# Patient Record
Sex: Female | Born: 1960 | Race: Black or African American | Hispanic: No | Marital: Married | Smoking: Never smoker
Health system: Southern US, Community
[De-identification: ages and names within clinical notes are randomized; demographics above are authoritative.]

## PROBLEM LIST (undated history)

## (undated) DIAGNOSIS — D649 Anemia, unspecified: Secondary | ICD-10-CM

## (undated) DIAGNOSIS — E785 Hyperlipidemia, unspecified: Secondary | ICD-10-CM

## (undated) DIAGNOSIS — J45909 Unspecified asthma, uncomplicated: Secondary | ICD-10-CM

## (undated) DIAGNOSIS — E119 Type 2 diabetes mellitus without complications: Secondary | ICD-10-CM

## (undated) DIAGNOSIS — I10 Essential (primary) hypertension: Secondary | ICD-10-CM

## (undated) HISTORY — DX: Hyperlipidemia, unspecified: E78.5

## (undated) HISTORY — DX: Anemia, unspecified: D64.9

## (undated) HISTORY — DX: Type 2 diabetes mellitus without complications: E11.9

## (undated) HISTORY — DX: Unspecified asthma, uncomplicated: J45.909

## (undated) HISTORY — PX: APPENDECTOMY: SHX54

## (undated) HISTORY — DX: Essential (primary) hypertension: I10

---

## 2005-06-21 ENCOUNTER — Ambulatory Visit: Payer: Self-pay | Admitting: Cardiology

## 2006-04-13 ENCOUNTER — Encounter (INDEPENDENT_AMBULATORY_CARE_PROVIDER_SITE_OTHER): Payer: Self-pay | Admitting: Internal Medicine

## 2006-09-19 ENCOUNTER — Ambulatory Visit: Payer: Self-pay | Admitting: Family Medicine

## 2006-09-19 DIAGNOSIS — R5381 Other malaise: Secondary | ICD-10-CM | POA: Insufficient documentation

## 2006-09-19 DIAGNOSIS — K219 Gastro-esophageal reflux disease without esophagitis: Secondary | ICD-10-CM | POA: Insufficient documentation

## 2006-09-19 DIAGNOSIS — R5383 Other fatigue: Secondary | ICD-10-CM

## 2006-09-27 ENCOUNTER — Encounter (INDEPENDENT_AMBULATORY_CARE_PROVIDER_SITE_OTHER): Payer: Self-pay | Admitting: Family Medicine

## 2006-10-02 LAB — CONVERTED CEMR LAB
ALT: 26 units/L (ref 0–35)
AST: 25 units/L (ref 0–37)
Albumin: 3.8 g/dL (ref 3.5–5.2)
Alkaline Phosphatase: 57 units/L (ref 39–117)
BUN: 16 mg/dL (ref 6–23)
Basophils Absolute: 0 10*3/uL (ref 0.0–0.1)
Basophils Relative: 0 % (ref 0–1)
CO2: 25 meq/L (ref 19–32)
Calcium: 8.7 mg/dL (ref 8.4–10.5)
Chloride: 104 meq/L (ref 96–112)
Cholesterol: 175 mg/dL (ref 0–200)
Creatinine, Ser: 0.85 mg/dL (ref 0.40–1.20)
Eosinophils Absolute: 0.3 10*3/uL (ref 0.0–0.7)
Eosinophils Relative: 3 % (ref 0–5)
Glucose, Bld: 96 mg/dL (ref 70–99)
HCT: 33.8 % — ABNORMAL LOW (ref 36.0–46.0)
HDL: 46 mg/dL (ref >39)
Hemoglobin: 10.7 g/dL — ABNORMAL LOW (ref 12.0–15.0)
LDL Cholesterol: 112 mg/dL — ABNORMAL HIGH (ref 0–99)
Lymphocytes Relative: 33 % (ref 12–46)
Lymphs Abs: 2.8 10*3/uL (ref 0.7–3.3)
MCHC: 31.7 g/dL (ref 30.0–36.0)
MCV: 80.3 fL (ref 78.0–100.0)
Monocytes Absolute: 0.5 10*3/uL (ref 0.2–0.7)
Monocytes Relative: 6 % (ref 3–11)
Neutro Abs: 4.8 10*3/uL (ref 1.7–7.7)
Neutrophils Relative %: 58 % (ref 43–77)
Platelets: 327 10*3/uL (ref 150–400)
Potassium: 4.4 meq/L (ref 3.5–5.3)
RBC: 4.21 M/uL (ref 3.87–5.11)
RDW: 17.2 % — ABNORMAL HIGH (ref 11.5–14.0)
Sodium: 140 meq/L (ref 135–145)
TSH: 1.743 microintl units/mL (ref 0.350–5.50)
Total Bilirubin: 0.3 mg/dL (ref 0.3–1.2)
Total CHOL/HDL Ratio: 3.8
Total Protein: 7 g/dL (ref 6.0–8.3)
Triglycerides: 87 mg/dL (ref <150)
VLDL: 17 mg/dL (ref 0–40)
WBC: 8.4 10*3/uL (ref 4.0–10.5)

## 2006-10-03 ENCOUNTER — Encounter (INDEPENDENT_AMBULATORY_CARE_PROVIDER_SITE_OTHER): Payer: Self-pay | Admitting: Family Medicine

## 2006-10-17 ENCOUNTER — Ambulatory Visit: Payer: Self-pay | Admitting: Family Medicine

## 2006-10-17 DIAGNOSIS — E785 Hyperlipidemia, unspecified: Secondary | ICD-10-CM | POA: Insufficient documentation

## 2006-10-17 DIAGNOSIS — D509 Iron deficiency anemia, unspecified: Secondary | ICD-10-CM | POA: Insufficient documentation

## 2006-10-17 LAB — CONVERTED CEMR LAB
Cholesterol, target level: 200 mg/dL
HDL goal, serum: 40 mg/dL
LDL Goal: 160 mg/dL

## 2006-10-18 ENCOUNTER — Encounter (INDEPENDENT_AMBULATORY_CARE_PROVIDER_SITE_OTHER): Payer: Self-pay | Admitting: Family Medicine

## 2006-10-18 LAB — CONVERTED CEMR LAB
Candida species: NEGATIVE
Gardnerella vaginalis: NEGATIVE
Pap Smear: NORMAL
Trichomonal Vaginitis: POSITIVE — AB

## 2006-10-19 LAB — CONVERTED CEMR LAB
Ferritin: 10 ng/mL (ref 10–291)
Folate: 16.2 ng/mL
Iron: 24 ug/dL — ABNORMAL LOW (ref 42–145)
Retic Count, Absolute: 84.6 (ref 19.0–186.0)
Retic Ct Pct: 2 % (ref 0.4–3.1)
Saturation Ratios: 7 % — ABNORMAL LOW (ref 20–55)
TIBC: 358 ug/dL (ref 250–470)
UIBC: 334 ug/dL
Vitamin B-12: 496 pg/mL (ref 211–911)

## 2006-11-06 ENCOUNTER — Ambulatory Visit (HOSPITAL_COMMUNITY): Admission: RE | Admit: 2006-11-06 | Discharge: 2006-11-06 | Payer: Self-pay | Admitting: Family Medicine

## 2006-11-30 ENCOUNTER — Ambulatory Visit: Payer: Self-pay | Admitting: Family Medicine

## 2006-12-01 ENCOUNTER — Encounter (INDEPENDENT_AMBULATORY_CARE_PROVIDER_SITE_OTHER): Payer: Self-pay | Admitting: Family Medicine

## 2006-12-01 DIAGNOSIS — N920 Excessive and frequent menstruation with regular cycle: Secondary | ICD-10-CM | POA: Insufficient documentation

## 2006-12-05 ENCOUNTER — Telehealth (INDEPENDENT_AMBULATORY_CARE_PROVIDER_SITE_OTHER): Payer: Self-pay | Admitting: *Deleted

## 2007-03-01 ENCOUNTER — Ambulatory Visit: Payer: Self-pay | Admitting: Family Medicine

## 2007-03-02 LAB — CONVERTED CEMR LAB
Basophils Absolute: 0 10*3/uL (ref 0.0–0.1)
Basophils Relative: 0 % (ref 0–1)
Eosinophils Absolute: 0.4 10*3/uL (ref 0.0–0.7)
Eosinophils Relative: 4 % (ref 0–5)
HCT: 36.4 % (ref 36.0–46.0)
Hemoglobin: 11.3 g/dL — ABNORMAL LOW (ref 12.0–15.0)
Lymphocytes Relative: 34 % (ref 12–46)
Lymphs Abs: 3.6 10*3/uL — ABNORMAL HIGH (ref 0.7–3.3)
MCHC: 31 g/dL (ref 30.0–36.0)
MCV: 79.3 fL (ref 78.0–100.0)
Monocytes Absolute: 0.5 10*3/uL (ref 0.2–0.7)
Monocytes Relative: 5 % (ref 3–11)
Neutro Abs: 6.2 10*3/uL (ref 1.7–7.7)
Neutrophils Relative %: 58 % (ref 43–77)
Platelets: 333 10*3/uL (ref 150–400)
RBC: 4.59 M/uL (ref 3.87–5.11)
RDW: 17.4 % — ABNORMAL HIGH (ref 11.5–14.0)
WBC: 10.8 10*3/uL — ABNORMAL HIGH (ref 4.0–10.5)

## 2007-03-14 ENCOUNTER — Ambulatory Visit (HOSPITAL_COMMUNITY): Admission: RE | Admit: 2007-03-14 | Discharge: 2007-03-14 | Payer: Self-pay | Admitting: Family Medicine

## 2007-03-15 ENCOUNTER — Telehealth (INDEPENDENT_AMBULATORY_CARE_PROVIDER_SITE_OTHER): Payer: Self-pay | Admitting: *Deleted

## 2007-04-20 ENCOUNTER — Ambulatory Visit: Payer: Self-pay | Admitting: Family Medicine

## 2007-04-27 ENCOUNTER — Ambulatory Visit: Payer: Self-pay | Admitting: Family Medicine

## 2007-04-28 ENCOUNTER — Encounter (INDEPENDENT_AMBULATORY_CARE_PROVIDER_SITE_OTHER): Payer: Self-pay | Admitting: Family Medicine

## 2007-05-11 ENCOUNTER — Ambulatory Visit: Payer: Self-pay | Admitting: Family Medicine

## 2007-06-08 ENCOUNTER — Ambulatory Visit: Payer: Self-pay | Admitting: Family Medicine

## 2007-06-08 LAB — CONVERTED CEMR LAB: Hemoglobin: 10 g/dL

## 2007-06-26 ENCOUNTER — Ambulatory Visit: Payer: Self-pay | Admitting: Family Medicine

## 2007-06-26 DIAGNOSIS — I10 Essential (primary) hypertension: Secondary | ICD-10-CM | POA: Insufficient documentation

## 2007-06-26 LAB — CONVERTED CEMR LAB
Glucose, Urine, Semiquant: NEGATIVE
Hemoglobin: 13.5 g/dL
Nitrite: NEGATIVE
Protein, U semiquant: 100
Specific Gravity, Urine: 1.025
Urobilinogen, UA: 0.2
pH: 6

## 2007-06-27 ENCOUNTER — Encounter (INDEPENDENT_AMBULATORY_CARE_PROVIDER_SITE_OTHER): Payer: Self-pay | Admitting: Family Medicine

## 2007-06-29 ENCOUNTER — Telehealth (INDEPENDENT_AMBULATORY_CARE_PROVIDER_SITE_OTHER): Payer: Self-pay | Admitting: Family Medicine

## 2007-06-29 LAB — CONVERTED CEMR LAB
BUN: 23 mg/dL (ref 6–23)
Basophils Absolute: 0 10*3/uL (ref 0.0–0.1)
Basophils Relative: 0 % (ref 0–1)
CO2: 22 meq/L (ref 19–32)
Calcium: 9 mg/dL (ref 8.4–10.5)
Chloride: 98 meq/L (ref 96–112)
Creatinine, Ser: 0.94 mg/dL (ref 0.40–1.20)
Eosinophils Absolute: 0.3 10*3/uL (ref 0.0–0.7)
Eosinophils Relative: 2 % (ref 0–5)
Glucose, Bld: 97 mg/dL (ref 70–99)
HCT: 38.8 % (ref 36.0–46.0)
Hemoglobin: 12.3 g/dL (ref 12.0–15.0)
Lymphocytes Relative: 31 % (ref 12–46)
Lymphs Abs: 3.4 10*3/uL (ref 0.7–4.0)
MCHC: 31.7 g/dL (ref 30.0–36.0)
MCV: 77.6 fL — ABNORMAL LOW (ref 78.0–100.0)
Monocytes Absolute: 0.7 10*3/uL (ref 0.1–1.0)
Monocytes Relative: 6 % (ref 3–12)
Neutro Abs: 6.7 10*3/uL (ref 1.7–7.7)
Neutrophils Relative %: 61 % (ref 43–77)
Platelets: 381 10*3/uL (ref 150–400)
Potassium: 4.7 meq/L (ref 3.5–5.3)
RBC: 5 M/uL (ref 3.87–5.11)
RDW: 17 % — ABNORMAL HIGH (ref 11.5–15.5)
Sodium: 137 meq/L (ref 135–145)
WBC: 11 10*3/uL — ABNORMAL HIGH (ref 4.0–10.5)

## 2007-08-03 ENCOUNTER — Telehealth (INDEPENDENT_AMBULATORY_CARE_PROVIDER_SITE_OTHER): Payer: Self-pay | Admitting: *Deleted

## 2007-08-03 ENCOUNTER — Ambulatory Visit: Payer: Self-pay | Admitting: Family Medicine

## 2007-08-03 DIAGNOSIS — M25519 Pain in unspecified shoulder: Secondary | ICD-10-CM | POA: Insufficient documentation

## 2007-08-08 ENCOUNTER — Encounter (HOSPITAL_COMMUNITY): Admission: RE | Admit: 2007-08-08 | Discharge: 2007-09-07 | Payer: Self-pay | Admitting: Family Medicine

## 2007-08-08 ENCOUNTER — Encounter (INDEPENDENT_AMBULATORY_CARE_PROVIDER_SITE_OTHER): Payer: Self-pay | Admitting: Family Medicine

## 2007-09-04 ENCOUNTER — Encounter (INDEPENDENT_AMBULATORY_CARE_PROVIDER_SITE_OTHER): Payer: Self-pay | Admitting: Family Medicine

## 2007-09-10 ENCOUNTER — Encounter (HOSPITAL_COMMUNITY): Admission: RE | Admit: 2007-09-10 | Discharge: 2007-10-10 | Payer: Self-pay | Admitting: Family Medicine

## 2007-09-13 ENCOUNTER — Encounter (INDEPENDENT_AMBULATORY_CARE_PROVIDER_SITE_OTHER): Payer: Self-pay | Admitting: Family Medicine

## 2007-09-14 ENCOUNTER — Ambulatory Visit: Payer: Self-pay | Admitting: Family Medicine

## 2007-10-03 ENCOUNTER — Encounter (INDEPENDENT_AMBULATORY_CARE_PROVIDER_SITE_OTHER): Payer: Self-pay | Admitting: Family Medicine

## 2007-10-27 ENCOUNTER — Encounter (INDEPENDENT_AMBULATORY_CARE_PROVIDER_SITE_OTHER): Payer: Self-pay | Admitting: Family Medicine

## 2007-10-29 ENCOUNTER — Telehealth (INDEPENDENT_AMBULATORY_CARE_PROVIDER_SITE_OTHER): Payer: Self-pay | Admitting: *Deleted

## 2007-10-29 LAB — CONVERTED CEMR LAB
ALT: 25 units/L (ref 0–35)
AST: 29 units/L (ref 0–37)
Albumin: 3.8 g/dL (ref 3.5–5.2)
Alkaline Phosphatase: 63 units/L (ref 39–117)
BUN: 14 mg/dL (ref 6–23)
Basophils Absolute: 0 10*3/uL (ref 0.0–0.1)
Basophils Relative: 0 % (ref 0–1)
CO2: 25 meq/L (ref 19–32)
Calcium: 8.4 mg/dL (ref 8.4–10.5)
Chloride: 105 meq/L (ref 96–112)
Cholesterol: 162 mg/dL (ref 0–200)
Creatinine, Ser: 0.84 mg/dL (ref 0.40–1.20)
Eosinophils Absolute: 0.4 10*3/uL (ref 0.0–0.7)
Eosinophils Relative: 5 % (ref 0–5)
Glucose, Bld: 97 mg/dL (ref 70–99)
HCT: 34.4 % — ABNORMAL LOW (ref 36.0–46.0)
HDL: 44 mg/dL (ref >39)
Hemoglobin: 10.3 g/dL — ABNORMAL LOW (ref 12.0–15.0)
LDL Cholesterol: 103 mg/dL — ABNORMAL HIGH (ref 0–99)
Lymphocytes Relative: 33 % (ref 12–46)
Lymphs Abs: 3 10*3/uL (ref 0.7–4.0)
MCHC: 29.9 g/dL — ABNORMAL LOW (ref 30.0–36.0)
MCV: 80.4 fL (ref 78.0–100.0)
Monocytes Absolute: 0.5 10*3/uL (ref 0.1–1.0)
Monocytes Relative: 5 % (ref 3–12)
Neutro Abs: 5.1 10*3/uL (ref 1.7–7.7)
Neutrophils Relative %: 56 % (ref 43–77)
Platelets: 349 10*3/uL (ref 150–400)
Potassium: 4.4 meq/L (ref 3.5–5.3)
RBC: 4.28 M/uL (ref 3.87–5.11)
RDW: 17.3 % — ABNORMAL HIGH (ref 11.5–15.5)
Sodium: 142 meq/L (ref 135–145)
TSH: 1.785 microintl units/mL (ref 0.350–5.50)
Total Bilirubin: 0.3 mg/dL (ref 0.3–1.2)
Total CHOL/HDL Ratio: 3.7
Total Protein: 7.1 g/dL (ref 6.0–8.3)
Triglycerides: 75 mg/dL (ref <150)
VLDL: 15 mg/dL (ref 0–40)
WBC: 9.1 10*3/uL (ref 4.0–10.5)

## 2007-11-27 ENCOUNTER — Ambulatory Visit (HOSPITAL_COMMUNITY): Admission: RE | Admit: 2007-11-27 | Discharge: 2007-11-27 | Payer: Self-pay | Admitting: Family Medicine

## 2007-12-21 ENCOUNTER — Ambulatory Visit: Payer: Self-pay | Admitting: Family Medicine

## 2007-12-21 LAB — CONVERTED CEMR LAB
Glucose, Urine, Semiquant: NEGATIVE
Ketones, urine, test strip: NEGATIVE
Nitrite: NEGATIVE
Protein, U semiquant: 100
Specific Gravity, Urine: >=1.03
Urobilinogen, UA: 1
WBC Urine, dipstick: NEGATIVE
pH: 6

## 2008-01-25 ENCOUNTER — Ambulatory Visit: Payer: Self-pay | Admitting: Family Medicine

## 2008-01-28 ENCOUNTER — Ambulatory Visit: Payer: Self-pay | Admitting: Family Medicine

## 2008-01-28 DIAGNOSIS — B354 Tinea corporis: Secondary | ICD-10-CM | POA: Insufficient documentation

## 2008-03-06 ENCOUNTER — Encounter (INDEPENDENT_AMBULATORY_CARE_PROVIDER_SITE_OTHER): Payer: Self-pay | Admitting: Family Medicine

## 2008-03-14 ENCOUNTER — Ambulatory Visit: Payer: Self-pay | Admitting: Family Medicine

## 2008-03-15 ENCOUNTER — Encounter (INDEPENDENT_AMBULATORY_CARE_PROVIDER_SITE_OTHER): Payer: Self-pay | Admitting: Family Medicine

## 2008-03-18 LAB — CONVERTED CEMR LAB
BUN: 16 mg/dL (ref 6–23)
Basophils Absolute: 0 10*3/uL (ref 0.0–0.1)
Basophils Relative: 0 % (ref 0–1)
CO2: 20 meq/L (ref 19–32)
Calcium: 8.7 mg/dL (ref 8.4–10.5)
Chloride: 104 meq/L (ref 96–112)
Creatinine, Ser: 0.95 mg/dL (ref 0.40–1.20)
Eosinophils Absolute: 0.4 10*3/uL (ref 0.0–0.7)
Eosinophils Relative: 4 % (ref 0–5)
Glucose, Bld: 116 mg/dL — ABNORMAL HIGH (ref 70–99)
HCT: 37 % (ref 36.0–46.0)
Hemoglobin: 11.5 g/dL — ABNORMAL LOW (ref 12.0–15.0)
Lymphocytes Relative: 26 % (ref 12–46)
Lymphs Abs: 2.7 10*3/uL (ref 0.7–4.0)
MCHC: 31.1 g/dL (ref 30.0–36.0)
MCV: 80.3 fL (ref 78.0–100.0)
Monocytes Absolute: 0.5 10*3/uL (ref 0.1–1.0)
Monocytes Relative: 4 % (ref 3–12)
Neutro Abs: 6.7 10*3/uL (ref 1.7–7.7)
Neutrophils Relative %: 65 % (ref 43–77)
Platelets: 342 10*3/uL (ref 150–400)
Potassium: 3.9 meq/L (ref 3.5–5.3)
RBC: 4.61 M/uL (ref 3.87–5.11)
RDW: 18.2 % — ABNORMAL HIGH (ref 11.5–15.5)
Sodium: 138 meq/L (ref 135–145)
WBC: 10.3 10*3/uL (ref 4.0–10.5)

## 2008-04-10 ENCOUNTER — Ambulatory Visit: Payer: Self-pay | Admitting: Family Medicine

## 2008-04-10 DIAGNOSIS — N63 Unspecified lump in unspecified breast: Secondary | ICD-10-CM | POA: Insufficient documentation

## 2008-04-14 ENCOUNTER — Ambulatory Visit (HOSPITAL_COMMUNITY): Admission: RE | Admit: 2008-04-14 | Discharge: 2008-04-14 | Payer: Self-pay | Admitting: Family Medicine

## 2008-04-25 ENCOUNTER — Ambulatory Visit: Payer: Self-pay | Admitting: Family Medicine

## 2008-04-25 DIAGNOSIS — J45909 Unspecified asthma, uncomplicated: Secondary | ICD-10-CM | POA: Insufficient documentation

## 2008-07-11 ENCOUNTER — Ambulatory Visit: Payer: Self-pay | Admitting: Family Medicine

## 2008-07-11 LAB — CONVERTED CEMR LAB

## 2008-12-05 ENCOUNTER — Ambulatory Visit: Payer: Self-pay | Admitting: Family Medicine

## 2008-12-05 DIAGNOSIS — R609 Edema, unspecified: Secondary | ICD-10-CM | POA: Insufficient documentation

## 2008-12-05 DIAGNOSIS — R079 Chest pain, unspecified: Secondary | ICD-10-CM | POA: Insufficient documentation

## 2008-12-05 DIAGNOSIS — L738 Other specified follicular disorders: Secondary | ICD-10-CM | POA: Insufficient documentation

## 2008-12-12 ENCOUNTER — Ambulatory Visit: Payer: Self-pay | Admitting: Family Medicine

## 2008-12-12 ENCOUNTER — Telehealth (INDEPENDENT_AMBULATORY_CARE_PROVIDER_SITE_OTHER): Payer: Self-pay | Admitting: *Deleted

## 2008-12-24 ENCOUNTER — Ambulatory Visit (HOSPITAL_COMMUNITY): Admission: RE | Admit: 2008-12-24 | Discharge: 2008-12-24 | Payer: Self-pay | Admitting: Family Medicine

## 2009-12-25 ENCOUNTER — Ambulatory Visit (HOSPITAL_COMMUNITY): Admission: RE | Admit: 2009-12-25 | Discharge: 2009-12-25 | Payer: Self-pay | Admitting: Family Medicine

## 2010-04-26 LAB — PULMONARY FUNCTION TEST

## 2010-07-18 ENCOUNTER — Encounter: Payer: Self-pay | Admitting: Family Medicine

## 2010-07-27 NOTE — Letter (Signed)
Summary: rehab progress note  rehab progress note   Imported By: Curtis Sites 10/09/2007 11:08:03  _____________________________________________________________________  External Attachment:    Type:   Image     Comment:   External Document

## 2010-07-27 NOTE — Consult Note (Signed)
Summary: consullt to decrease pain  consullt to decrease pain   Imported By: Donneta Romberg 05/01/2007 09:20:14  _____________________________________________________________________  External Attachment:    Type:   Image     Comment:   External Document

## 2010-07-27 NOTE — Assessment & Plan Note (Signed)
Summary: 6 WK F/U   Vital Signs:  Patient Profile:   50 Years Old Female Height:     67.5 inches Weight:      289 pounds BMI:     44.76 O2 Sat:      99 % Temp:     98.1 degrees F Pulse rate:   86 / minute Resp:     16 per minute BP sitting:   136 / 88  Vitals Entered By: Sherilyn Banker (November 30, 2006 4:00 PM)               PCP:  Franchot Heidelberg, MD  Chief Complaint:  follow up visit.  History of Present Illness: Pt in for recheck.  She recently had a complete physical. Results are reviewed and she was advised on normal mammogram, pap smear. She was found to have positive Trich and this was treated. She tolerated flagyl well and notes her partner (husband) was treated as well. Curious how she got this.  Labs are also reviewed. She has a normocytic anemia likley related to excessively heavy periods. Iron studies showed low iron sat and normal B12 and folate. She notes her periods drench her pads and lasts this way for at least three days. She denies excessive malaise and fatigue and has no blood in her stool. She does gets some suprapubic pain with her periods and is worried about the bleeding.  She has foot pain. States it is just on the left. Notes mild to mod. Starts in heel. Radiates to forefoot. Burn to sharp. Does stand a lot. Has not self treated. Sx woth with standing. Better with rest.  She now presents.  Current Allergies (reviewed today): No known allergies   Past Medical History:    Reviewed history from 09/19/2006 and no changes required:       GERD  Past Surgical History:    Reviewed history from 09/19/2006 and no changes required:       Appendectomy   Family History:    Reviewed history from 09/19/2006 and no changes required:       father - hx unknown       mother - breast/ ovarian cancer       4 sisters - 1 with DM       3 children - 2 with Muscular Dystrophy  Social History:    Reviewed history from 09/19/2006 and no changes required:  Married       Never Smoked       Alcohol use-no       Drug use-no   Risk Factors:  Mammogram History:     Date of Last Mammogram:  11/06/2006    Results:  normal   PAP Smear History:     Date of Last PAP Smear:  10/18/2006    Results:  normal    Review of Systems      See HPI   Physical Exam  General:     Well-developed,well-nourished,in no acute distress; alert,appropriate and cooperative throughout examination. Obese. Lungs:     Normal respiratory effort, chest expands symmetrically. Lungs are clear to auscultation, no crackles or wheezes. Heart:     Normal rate and regular rhythm. S1 and S2 normal without gallop, murmur, click, rub or other extra sounds. Abdomen:     Bowel sounds positive,abdomen soft and non-tender without masses, organomegaly or hernias noted. Extremities:     No clubbing, cyanosis, edema, or deformity noted with normal full range of motion of all joints.  Normal foot eval with slight pain base of heel. Psych:     Cognition and judgment appear intact. Alert and cooperative with normal attention span and concentration. No apparent delusions, illusions, hallucinations    Impression & Recommendations:  Problem # 1:  ANEMIA, IRON DEFICIENCY (ICD-280.9) Discussed. Refer Pelvic Ultrasound to r/o uterine causes for heavy periods such as fibroids. Nurse to schedule. Start Iron and recheck in three months - sooner if worse or pending ultrasound result. Councelled risk and benefit of med use and if sx do not resolve with Rx or no obvious cause  per ultrasound will need to consider colonoscopy. Her updated medication list for this problem includes:    Ferrous Sulfate 325 (65 Fe) Mg Tabs (Ferrous sulfate) .Marland Kitchen..Marland Kitchen Two times a day   Problem # 2:  MORBID OBESITY (ICD-278.01) Weight loss a must. TLC advised. Recheck three months and optomize.  Problem # 3:  ELEVATED BLOOD PRESSURE WITHOUT DIAGNOSIS OF HYPERTENSION (ICD-796.2) Diet and exersize and low salt intake  a must. Follow.  Problem # 4:  VULVOVAGINITIS, TRICHOMONAL (ICD-131.01) Resolved. Councelled that this is STD but cannot commenet how long she has had this. Advised safe sex.  Medications Added to Medication List This Visit: 1)  Ferrous Sulfate 325 (65 Fe) Mg Tabs (Ferrous sulfate) .... Two times a day   Patient Instructions: 1)  Please schedule a follow-up appointment in 3 months - sooner if needed.   Preventive Care Screening  Mammogram:    Date:  11/06/2006    Next Due:  10/2007    Results:  normal   Pap Smear:    Date:  10/18/2006    Next Due:  10/2007    Results:  normal

## 2010-07-27 NOTE — Letter (Signed)
Summary: [facility]   Imported By: Curtis Sites 08/16/2007 09:02:12  _____________________________________________________________________  External Attachment:    Type:   Image     Comment:   External Document

## 2010-07-27 NOTE — Letter (Signed)
Summary: External Other  External Other   Imported By: Curtis Sites 09/05/2007 10:06:15  _____________________________________________________________________  External Attachment:    Type:   Image     Comment:   External Document

## 2010-07-27 NOTE — Assessment & Plan Note (Signed)
Summary: Recheck/arc   Vital Signs:  Patient Profile:   50 Years Old Female Height:     67.5 inches Weight:      289 pounds BMI:     44.91 O2 Sat:      99 % Temp:     97.3 degrees F Pulse rate:   70 / minute Resp:     16 per minute BP sitting:   133 / 81  Vitals Entered By: Sherilyn Banker (August 03, 2007 10:06 AM)                 PCP:  Franchot Heidelberg, MD  Chief Complaint:  follow up visit.  History of Present Illness: Pt in for recheck.  She was recently seen due to dizzyness and it was felt that this was related to viral URI. HSe was started on Meclizine and states sx resolved within 48 hours of taking. She denies dizzyness and headaches. SHe denies weakness in arms and legs. No falls. Denies ear pain, sore throat and runny nose. Labs reviewed:  1. CBC - WBC 11.MCV 77 2. BMP - normal  She was taken of diuretic due the above and astarted Norvasc. She has gained weight and attributes this to Noravasc that she was started on. She notes started after starting latter. SHe denies any other side-effects. Denies chest pain, shortness of breath or palpitations.Weight up ten pounds in 4 weeks. States a little anxious about fluid.   She has left shoulder stiffness. Had steroid injection. States just cannot reach all the way above head. Hurts to trhow objects. Curios if she can do PT.  Now presents.  Hypertension History:      Positive major cardiovascular risk factors include hyperlipidemia and hypertension.  Negative major cardiovascular risk factors include female age less than 36 years old, no history of diabetes, negative family history for ischemic heart disease, and non-tobacco-user status.        Further assessment for target organ damage reveals no history of ASHD, stroke/TIA, or peripheral vascular disease.    Lipid Management History:      Positive NCEP/ATP III risk factors include hypertension.  Negative NCEP/ATP III risk factors include female age less than 86 years  old, no history of early menopause without estrogen hormone replacement, non-diabetic, no family history for ischemic heart disease, non-tobacco-user status, no ASHD (atherosclerotic heart disease), no prior stroke/TIA, no peripheral vascular disease, and no history of aortic aneurysm.        The patient states that she knows about the "Therapeutic Lifestyle Change" diet.  Her compliance with the TLC diet is fair.  The patient expresses understanding of adjunctive measures for cholesterol lowering.  Adjunctive measures started by the patient include aerobic exercise, fiber, ASA, and omega-3 supplements.      Current Allergies (reviewed today): No known allergies   Past Medical History:    Reviewed history from 09/19/2006 and no changes required:       GERD  Past Surgical History:    Reviewed history from 09/19/2006 and no changes required:       Appendectomy   Family History:    Reviewed history from 09/19/2006 and no changes required:       father - hx unknown       mother - breast/ ovarian cancer       4 sisters - 1 with DM       3 children - 2 with Muscular Dystrophy  Social History:    Reviewed history from  09/19/2006 and no changes required:       Married       Never Smoked       Alcohol use-no       Drug use-no   Risk Factors: Tobacco use:  never Drug use:  no Alcohol use:  no  Family History Risk Factors:    Family History of MI in females < 33 years old:  no    Family History of MI in males < 34 years old:  no  Mammogram History:    Date of Last Mammogram:  11/06/2006  PAP Smear History:    Date of Last PAP Smear:  10/18/2006   Review of Systems      See HPI   Physical Exam  General:     Well-developed,well-nourished,in no acute distress; alert,appropriate and cooperative throughout examination Lungs:     Normal respiratory effort, chest expands symmetrically. Lungs are clear to auscultation, no crackles or wheezes. Heart:     Normal rate and  regular rhythm. S1 and S2 normal without gallop, murmur, click, rub or other extra sounds. Abdomen:     Bowel sounds positive,abdomen soft and non-tender without masses, organomegaly or hernias noted. Extremities:     No clubbing, cyanosis, edema, or deformity noted with normal full range of motion of all joints.  Legft shoulde stiffness - cannot reach all the way above head or behind her. States shoulder feels stiff and aches. Neurologic:     No cranial nerve deficits noted. Station and gait are normal. Plantar reflexes are down-going bilaterally. DTRs are symmetrical throughout. Sensory, motor and coordinative functions appear intact. Cervical Nodes:     No lymphadenopathy noted Psych:     Cognition and judgment appear intact. Alert and cooperative with normal attention span and concentration. No apparent delusions, illusions, hallucinations    Impression & Recommendations:  Problem # 1:  DIZZINESS (ICD-780.4) Discussed. Reslved. Possible HCTZ induced as sx stopped when this was [facility] and when she used short term meclizine.Follow for recurrence. Her updated medication list for this problem includes:    Meclizine Hcl 25 Mg Tabs (Meclizine hcl) ..... One every 6 hours as needed   Problem # 2:  ESSENTIAL HYPERTENSION, BENIGN (ICD-401.1) BP markedly improved. Note peripheral edema and weight gain. Rx as is with diet, exersize and portion control.  Councelled edema possible side-effect on Amloidpine. Recheck 6 weeks - sooner if worse. Advised low salt diet. Her updated medication list for this problem includes:    Amlodipine Besylate 5 Mg Tabs (Amlodipine besylate) ..... One daily   Problem # 3:  ANEMIA, IRON DEFICIENCY (ICD-280.9) Stable. Rx as below. Her updated medication list for this problem includes:    Ferrous Sulfate 325 (65 Fe) Mg Tabs (Ferrous sulfate) .Marland Kitchen..Marland Kitchen Two times a day   Problem # 4:  MORBID OBESITY (ICD-278.01) See above. TLC a must.  Problem # 5:  SHOULDER PAIN, LEFT  (ICD-719.41) Refer PT to eval and treat. If not improved with this, refer Ortho. Requests appt after 3:30 pm due to work schedule. Her updated medication list for this problem includes:    Ec-naprosyn 500 Mg Tbec (Naproxen) .Marland Kitchen..Marland Kitchen Two times a day  Orders: Physical Therapy Referral (PT)   Complete Medication List: 1)  Ferrous Sulfate 325 (65 Fe) Mg Tabs (Ferrous sulfate) .... Two times a day 2)  Ec-naprosyn 500 Mg Tbec (Naproxen) .... Two times a day 3)  Amlodipine Besylate 5 Mg Tabs (Amlodipine besylate) .... One daily 4)  Meclizine Hcl 25 Mg  Tabs (Meclizine hcl) .... One every 6 hours as needed  Hypertension Assessment/Plan:      The patient's hypertensive risk group is category B: At least one risk factor (excluding diabetes) with no target organ damage.  Her calculated 10 year risk of coronary heart disease is 5 %.  Today's blood pressure is 133/81.  Her blood pressure goal is < 140/90.  Lipid Assessment/Plan:      Based on NCEP/ATP III, the patient's risk factor category is "0-1 risk factors".  From this information, the patient's calculated lipid goals are as follows: Total cholesterol goal is 200; LDL cholesterol goal is 160; HDL cholesterol goal is 40; Triglyceride goal is 150.     Patient Instructions: 1)  Please schedule a follow-up appointment in 6 weeks - sooner if leg swelling worsen. Agrees.    ]

## 2010-07-27 NOTE — Assessment & Plan Note (Signed)
Summary: FOLLOW UP 3 MONTH/SLJ   Vital Signs:  Patient Profile:   50 Years Old Female Height:     67.5 inches Weight:      282 pounds BMI:     43.67 O2 Sat:      100 % Temp:     97.6 degrees F Pulse rate:   69 / minute Resp:     14 per minute BP sitting:   138 / 86  Vitals Entered By: Sherilyn Banker (December 21, 2007 3:31 PM)                 PCP:  Franchot Heidelberg, MD  Chief Complaint:  FOLLOW UP VISIT.  History of Present Illness: Pt in for recheck.  She needs recheck on HTN, Hyperlipidemia.  She denies any specific concern except some mild leg swelling. States comes and goes. More at end of day. Better in am. States fullness causes some pain and particulalry bad left foot. States heel hurts as does bottom of foot. Dull pain and worse with walking. Better with elevation. States pain can be a 2/10. She has used Aleve and does alleviate pain. SHe denies chest pain, SOB, orthopnea, PND, palpitations. She is using BP med daily and denies side-effects. She is on Amlodipine and is aware this can cause leg swelling.  She has not seen eye MD and states saw dentisit.  She states she has heavy periods. States sometimes twice a month. States bleeds for 4 days. Denies malaise ans fatigue and not using iron. States no pelvic pain. Had mmmogram June , 2009 and normal. Happy about result. She has an iron def anemia. Agreeable to GYN eval. Had [place] Sept 2009 -   1. Mild uterine enlargement with small intramural fibroids.  2. Mild thickening of the endometrium may be physiologic in this  patient who is still menstruating, and no focal endometrial  abnormalities are identified.    3. The left ovary appears normal.  The right ovary is not seen.   States not sure if she wants hx. She needs a physical and states if refering to GYN would like them to do.  Labs from last visit reviewed:  1. CBC - normocytic anemia with HGB 10.3 and normal MCV 2. CMP - normal 3. TSH - normal 4. Lipids - perfect  per ATP III goals.  She now presents.   Hypertension History:      Positive major cardiovascular risk factors include hyperlipidemia and hypertension.  Negative major cardiovascular risk factors include female age less than 29 years old, no history of diabetes, negative family history for ischemic heart disease, and non-tobacco-user status.        Further assessment for target organ damage reveals no history of ASHD, stroke/TIA, or peripheral vascular disease.    Lipid Management History:      Positive NCEP/ATP III risk factors include hypertension.  Negative NCEP/ATP III risk factors include female age less than 14 years old, no history of early menopause without estrogen hormone replacement, non-diabetic, no family history for ischemic heart disease, non-tobacco-user status, no ASHD (atherosclerotic heart disease), no prior stroke/TIA, no peripheral vascular disease, and no history of aortic aneurysm.        The patient states that she knows about the "Therapeutic Lifestyle Change" diet.  Her compliance with the TLC diet is fair.  The patient expresses understanding of adjunctive measures for cholesterol lowering.  Adjunctive measures started by the patient include aerobic exercise, fiber, and omega-3 supplements.  Prior Medications Reviewed Using: Patient Recall  Updated Prior Medication List: Amlodipine 5 mg daily Current Allergies (reviewed today): No known allergies   Past Medical History:    Reviewed history from 09/19/2006 and no changes required:       Current Problems:        SHOULDER PAIN, LEFT (ICD-719.41)       ESSENTIAL HYPERTENSION, BENIGN (ICD-401.1)       EXCESSIVE MENSTRUATION (ICD-626.2)       ANEMIA, IRON DEFICIENCY (ICD-280.9)       HYPERLIPIDEMIA (ICD-272.4)       MALAISE AND FATIGUE (ICD-780.79)       MORBID OBESITY (ICD-278.01)       GERD (ICD-530.81)         Past Surgical History:    Reviewed history from 09/19/2006 and no changes required:        Appendectomy    Risk Factors: Tobacco use:  never Drug use:  no Alcohol use:  no  Family History Risk Factors:    Family History of MI in females < 58 years old:  no    Family History of MI in males < 33 years old:  no  PAP Smear History:    Date of Last PAP Smear:  10/18/2006  Mammogram History:     Date of Last Mammogram:  11/27/2007    Results:  normal    Review of Systems      See HPI   Physical Exam  General:     Well-developed,well-nourished,in no acute distress; alert,appropriate and cooperative throughout examination. Obese. Lungs:     Normal respiratory effort, chest expands symmetrically. Lungs are clear to auscultation, no crackles or wheezes. Heart:     Normal rate and regular rhythm. S1 and S2 normal without gallop, murmur, click, rub or other extra sounds. Abdomen:     Bowel sounds positive,abdomen soft and non-tender without masses, organomegaly or hernias noted. Extremities:     Trace bilateral ankle edema. Left foot exam normal excpet for reprodcuable pain left heel plantar fascia insertion site. Psych:     Cognition and judgment appear intact. Alert and cooperative with normal attention span and concentration. No apparent delusions, illusions, hallucinations    Impression & Recommendations:  Problem # 1:  EXCESSIVE MENSTRUATION (ICD-626.2) Discussed. Anemia back as no longer using iron. Resume supplement. Refer GYN to help optomize - may be candidate for endometrial ablation, hysterectomy or birth control. Await GYn eval and input. Will defer pap smear and breats exam to them as well per patient request. Note family hx of breast/ovarian cancer. Agrees. Orders: Gynecologic Referral (Gyn)   Problem # 2:  ANEMIA, IRON DEFICIENCY (ICD-280.9) Secondary to above. resolved with iron supplement. Now of and recurent. Optomize menstruation and deficiency should resolve. Pt agrees. Her updated medication list for this problem includes:    Ferrous Sulfate  325 (65 Fe) Mg Tabs (Ferrous sulfate) .Marland Kitchen..Marland Kitchen Two times a day  Orders: Gynecologic Referral (Gyn)   Problem # 3:  MORBID OBESITY (ICD-278.01) TLC again advised. Weight down 6 pounds. Stay course and encouraged.  Problem # 4:  HYPERLIPIDEMIA (ICD-272.4) To goal per ATP III. TLC only. No Rx needed. Recheck one year.  Problem # 5:  ESSENTIAL HYPERTENSION, BENIGN (ICD-401.1) Well controlled but leg swelling on CCB. DC and start low dose Carvedilol. Recheck 4 weeks. Limit salt. Advised importance eye and dental care. UA findings consisatnt with period. Reassured. Repeat once latter resolved to assure no protein. The following medications were removed from the  medication list:    Amlodipine Besylate 5 Mg Tabs (Amlodipine besylate) ..... One daily  Her updated medication list for this problem includes:    Carvedilol 6.25 Mg Tabs (Carvedilol) .Marland Kitchen..Marland Kitchen Two times a day  Orders: UA Dipstick w/o Micro (manual) (16109)   Problem # 6:  PLANTAR FASCIITIS, LEFT (ICD-728.71) Trial Naproxen. Handout given. Start foot stretches. Recheck 4 weeks and consider medrol pack, local steroid injection. Councelled proper shoes and foot care. Her updated medication list for this problem includes:    Ec-naprosyn 500 Mg Tbec (Naproxen) .Marland Kitchen..Marland Kitchen Two times a day   Problem # 7:  Preventive Health Care (ICD-V70.0) Defer GYN. Agrees.  Complete Medication List: 1)  Ferrous Sulfate 325 (65 Fe) Mg Tabs (Ferrous sulfate) .... Two times a day 2)  Ec-naprosyn 500 Mg Tbec (Naproxen) .... Two times a day 3)  Carvedilol 6.25 Mg Tabs (Carvedilol) .... Two times a day  Hypertension Assessment/Plan:      The patient's hypertensive risk group is category B: At least one risk factor (excluding diabetes) with no target organ damage.  Her calculated 10 year risk of coronary heart disease is 6 %.  Today's blood pressure is 138/86.  Her blood pressure goal is < 140/90.  Lipid Assessment/Plan:      Based on NCEP/ATP III, the patient's  risk factor category is "0-1 risk factors".  From this information, the patient's calculated lipid goals are as follows: Total cholesterol goal is 200; LDL cholesterol goal is 160; HDL cholesterol goal is 40; Triglyceride goal is 150.     Patient Instructions: 1)  Please schedule a follow-up appointment in 1 month.   Prescriptions: FERROUS SULFATE 325 (65 FE) MG TABS (FERROUS SULFATE) two times a day  #60 x 3   Entered and Authorized by:   Franchot Heidelberg MD   Signed by:   Franchot Heidelberg MD on 12/21/2007   Method used:   Print then Give to Patient   RxID:   [id number] EC-NAPROSYN 500 MG  TBEC (NAPROXEN) two times a day  #20 x 0   Entered and Authorized by:   Franchot Heidelberg MD   Signed by:   Franchot Heidelberg MD on 12/21/2007   Method used:   Print then Give to Patient   RxID:   [id number] CARVEDILOL 6.25 MG  TABS (CARVEDILOL) two times a day  #60 x 3   Entered and Authorized by:   Franchot Heidelberg MD   Signed by:   Franchot Heidelberg MD on 12/21/2007   Method used:   Print then Give to Patient   RxID:   [id number]  ] Laboratory Results   Urine Tests    Routine Urinalysis   Color: red Appearance: Clear Glucose: negative   (Normal Range: Negative) Bilirubin: small   (Normal Range: Negative) Ketone: negative   (Normal Range: Negative) Spec. Gravity: >=1.030   (Normal Range: 1.003-1.035) Blood: large   (Normal Range: Negative) pH: 6.0   (Normal Range: 5.0-8.0) Protein: 100   (Normal Range: Negative) Urobilinogen: 1.0   (Normal Range: 0-1) Nitrite: negative   (Normal Range: Negative) Leukocyte Esterace: negative   (Normal Range: Negative)         Preventive Care Screening  Mammogram:    Date:  11/27/2007    Next Due:  11/2008    Results:  normal   Prior Values:    Pap Smear:  normal (10/18/2006)    Mammogram:  normal (11/06/2006)    Last Flu Shot:  Fluvax Non-MCR (04/20/2007)  Appended Document: FOLLOW UP 3  MONTH/SLJ faxed referral to Dr. Lorna Few office. they will make appointment and get intouch with patient regarding referal

## 2010-07-27 NOTE — Assessment & Plan Note (Signed)
Summary: FOLLOW UP/ DMS   Vital Signs:  Patient Profile:   50 Years Old Female Height:     67.5 inches Weight:      283 pounds BMI:     43.83 O2 Sat:      100 % Temp:     97.8 degrees F Pulse rate:   87 / minute Resp:     14 per minute BP sitting:   145 / 84  Vitals Entered By: Sherilyn Banker (May 11, 2007 3:32 PM)                 PCP:  Franchot Heidelberg, MD  Chief Complaint:  follow up visit.  History of Present Illness: Pt in for recheck.  She had a recent infected sebacous cyst under her left breast on chest wall. This was I&D. She states doing much better. No pain. Denies dicharge and drainage. She can still feel a little bump. Denies redness and disharge and feels has healed well. No report of fever, chills and sweats.  Now presents.  Hypertension History:      Positive major cardiovascular risk factors include hyperlipidemia and hypertension.  Negative major cardiovascular risk factors include female age less than 66 years old, no history of diabetes, negative family history for ischemic heart disease, and non-tobacco-user status.        Further assessment for target organ damage reveals no history of ASHD, stroke/TIA, or peripheral vascular disease.     Current Allergies: No known allergies      Review of Systems      See HPI   Physical Exam  General:     Well-developed,well-nourished,in no acute distress; alert,appropriate and cooperative throughout examination Lungs:     Normal respiratory effort, chest expands symmetrically. Lungs are clear to auscultation, no crackles or wheezes. Heart:     Normal rate and regular rhythm. S1 and S2 normal without gallop, murmur, click, rub or other extra sounds. Skin:     Wound under left arm on lateral chest healed. No mass or lump. No infection.Area of hyperpigemntation noted in shape of bandaide.    Impression & Recommendations:  Problem # 1:  ABSCESS, SKIN (ICD-682.9) Resolved. Follow. Pt reassured.  Advised sebaceous cyst can re-occur as I was unable to remove whole cyst given infection and rupture. Agrees. Update if any concern. Agrees. Her updated medication list for this problem includes:    Doxy-caps 100 Mg Caps (Doxycycline hyclate) ..... One two times a day with plenty of food and water   Complete Medication List: 1)  Ferrous Sulfate 325 (65 Fe) Mg Tabs (Ferrous sulfate) .... Two times a day 2)  Ec-naprosyn 500 Mg Tbec (Naproxen) .... Two times a day 3)  Doxy-caps 100 Mg Caps (Doxycycline hyclate) .... One two times a day with plenty of food and water  Hypertension Assessment/Plan:      The patient's hypertensive risk group is category B: At least one risk factor (excluding diabetes) with no target organ damage.  Her calculated 10 year risk of coronary heart disease is 7 %.  Today's blood pressure is 145/84.  Her blood pressure goal is < 140/90.     ]

## 2010-07-27 NOTE — Letter (Signed)
Summary: [facility] Note  [facility] Note   Imported By: Lutricia Horsfall 05/29/2007 11:10:03  _____________________________________________________________________  External Attachment:    Type:   Image     Comment:   External Document

## 2010-07-27 NOTE — Assessment & Plan Note (Signed)
Summary: 3 mon f/u   Vital Signs:  Patient Profile:   50 Years Old Female Height:     67.5 inches Weight:      281 pounds BMI:     43.52 O2 Sat:      98 % Temp:     97.8 degrees F Pulse rate:   80 / minute Resp:     14 per minute BP sitting:   157 / 90  Vitals Entered By: Sherilyn Banker (March 01, 2007 4:09 PM)                 PCP:  Franchot Heidelberg, MD  Chief Complaint:  follow up visit.  History of Present Illness: Pt in for recheck.  She lost her son January 02, 2007 related to CHF and muscular dystrophy. This has been very hard.She is sleepig good at night. She is not irritable and her concentrations. She copes with stress by exersizing. States death was expected but told it may have taken longer for him to pass. She has no suicidal ideations and has good support in family. Has two other children. Doing well. Her oldest son is 63 and has muscular dystrophy but no CHF. Doing well.Her daughter is 63 and healthy.  She had her female exam earlier this year. Had Trich and this was treated. No vaginal complaints. She has an iron def anemia andshe admits to bvery heavy periods. She missed her appt for pelvic ultrasound due to her son's death and needs [place] to reschedule. She is tired and worn out. She would be agreeable with iron.   She also has left arm pain. States middle of upper arm.Sx for few months. Cannot lie on it. She describes her pain as a cutting sensation with arm motion. Worse with arm extension and lifting. Better with not lifting. She has not self treated except for Aleve - took 5 pills in a week. No trauma or injury reported.  She has lost 8 pounds since last visit. Started exersize, eating better (more smked and less fried). Knows obesity places her at risk.Wants to keep losing weight.  Now presents.  Hypertension History:      She denies headache, chest pain, palpitations, dyspnea with exertion, orthopnea, PND, peripheral edema, visual symptoms, neurologic  problems, syncope, and side effects from treatment.        Positive major cardiovascular risk factors include hyperlipidemia and hypertension.  Negative major cardiovascular risk factors include female age less than 72 years old, no history of diabetes, negative family history for ischemic heart disease, and non-tobacco-user status.        Further assessment for target organ damage reveals no history of ASHD, stroke/TIA, or peripheral vascular disease.    Lipid Management History:      Positive NCEP/ATP III risk factors include hypertension.  Negative NCEP/ATP III risk factors include female age less than 2 years old, no history of early menopause without estrogen hormone replacement, non-diabetic, no family history for ischemic heart disease, non-tobacco-user status, no ASHD (atherosclerotic heart disease), no prior stroke/TIA, no peripheral vascular disease, and no history of aortic aneurysm.      Current Allergies (reviewed today): No known allergies   Past Medical History:    Reviewed history from 09/19/2006 and no changes required:       GERD  Past Surgical History:    Reviewed history from 09/19/2006 and no changes required:       Appendectomy   Family History:    Reviewed history from  09/19/2006 and no changes required:       father - hx unknown       mother - breast/ ovarian cancer       4 sisters - 1 with DM       3 children - 2 with Muscular Dystrophy  Social History:    Reviewed history from 09/19/2006 and no changes required:       Married       Never Smoked       Alcohol use-no       Drug use-no    Review of Systems      See HPI   Physical Exam  General:     Well-developed,well-nourished,in no acute distress; alert,appropriate and cooperative throughout examination. Obese. Lungs:     Normal respiratory effort, chest expands symmetrically. Lungs are clear to auscultation, no crackles or wheezes. Heart:     Normal rate and regular rhythm. S1 and S2 normal  without gallop, murmur, click, rub or other extra sounds. Abdomen:     Bowel sounds positive,abdomen soft and non-tender without masses, organomegaly or hernias noted. Extremities:     No clubbing, cyanosis, edema, or deformity noted with normal full range of motion of all joints.  Tender insertion site of left deltoid.  Psych:     Cognition and judgment appear intact. Alert and cooperative with normal attention span and concentration. No apparent delusions, illusions, hallucinations    Impression & Recommendations:  Problem # 1:  EXCESSIVE MENSTRUATION (ICD-626.2) Discussed. Due to son's death she never had her ultrasound. Nurse to refer. Await result and consider GYN consult as she may benefit from hx.  Problem # 2:  ANEMIA, IRON DEFICIENCY (ICD-280.9) Repeat CBC and resume iron. Likely related to the above. Her updated medication list for this problem includes:    Ferrous Sulfate 325 (65 Fe) Mg Tabs (Ferrous sulfate) .Marland Kitchen..Marland Kitchen Two times a day  Orders: T-CBC w/Diff ([id number])   Problem # 3:  GRIEF REACTION, ACUTE (ICD-309.0) Councelled phases. Follow.  Problem # 4:  MORBID OBESITY (ICD-278.01) Congratulated on weight loss. Stay course. Monitor.  Problem # 5:  HYPERTENSION (ICD-401) Advised TLC for three months as she now has dx of HTN. If remians elewvated will need low dose HCTZ. Discussed diet, exersize, low salt intake and need for yearly eye exam. Suspect anemia may play into this some.   Problem # 6:  ARM PAIN, LEFT (ICD-729.5) Suspect bursitis or tendonitis. Trial naproxen, heating pad. Recheck if worse or persisit.  Complete Medication List: 1)  Ferrous Sulfate 325 (65 Fe) Mg Tabs (Ferrous sulfate) .... Two times a day 2)  Ec-naprosyn 500 Mg Tbec (Naproxen) .... Two times a day  Hypertension Assessment/Plan:      The patient's hypertensive risk group is category B: At least one risk factor (excluding diabetes) with no target organ damage.  Her calculated 10 year risk  of coronary heart disease is 7 %.  Today's blood pressure is 157/90.  Her blood pressure goal is < 140/90.  Lipid Assessment/Plan:      Based on NCEP/ATP III, the patient's risk factor category is "0-1 risk factors".  From this information, the patient's calculated lipid goals are as follows: Total cholesterol goal is 200; LDL cholesterol goal is 160; HDL cholesterol goal is 40; Triglyceride goal is 150.     Patient Instructions: 1)  Please schedule a follow-up appointment in 6 weeks.    Prescriptions: FERROUS SULFATE 325 (65 FE) MG TABS (FERROUS SULFATE) two times a  day  #60 x 3   Entered and Authorized by:   Franchot Heidelberg MD   Signed by:   Franchot Heidelberg MD on 03/01/2007   Method used:   Print then Give to Patient   RxID:   [id number] EC-NAPROSYN 500 MG  TBEC (NAPROXEN) two times a day  #20 x 0   Entered and Authorized by:   Franchot Heidelberg MD   Signed by:   Franchot Heidelberg MD on 03/01/2007   Method used:   Print then Give to Patient   RxID:   [id number]   Appended Document: Orders Update    Clinical Lists Changes  Orders: Added new Test order of Ultrasound (Ultrasound) - Signed  Appt on 03/14/2007 at 2:00   Appended Document: 3 mon f/u appt scheduled for 03/14/2007 at 2:30 pt notified

## 2010-07-27 NOTE — Assessment & Plan Note (Signed)
Summary: KNOT IN BREAST/LEG SWELLING/ARC   Vital Signs:  Patient profile:   50 year old female Height:      67.5 inches Weight:      286 pounds BMI:     44.29 O2 Sat:      100 % Temp:     98.0 degrees F Pulse rate:   71 / minute Resp:     14 per minute BP sitting:   152 / 87  Vitals Entered By: Sherilyn Banker LPN (December 05, 2008 3:01 PM)  Nutrition Counseling: Patient's BMI is greater than 25 and therefore counseled on weight management options. CC: Knot on R Breast, Chest pains, L foot swelling, Hypertension Management, Lipid Management Nutritional Status BMI of > 30 = obese   Primary Provider:  Franchot Heidelberg, MD  CC:  Knot on R Breast, Chest pains, L foot swelling, Hypertension Management, and Lipid Management.  History of Present Illness: Pt in for acute visit.   She states she has had a knot on her right breast. Feels like bump. No pain. She state notcied this as it turned. Denies drainage and discharge and states a lot smaller than before. Not in breast but on skin. She is due for mammogram but has no insurance at this time. States spouse was laid off in Dec and this has no insurance. She denis fever, chills and sweat. She did not self treat and states just going away by itself.  She states she has had some dull chest pain. Across whole chest. She has this on and off. She states comes on in rest and exertion. States localized. Worse with housework. Better wiht resting.  She has used Aleve 220 mg two once a week and it helps. She denies SOB, orthopnea, PND, palpitations and states her left foot has been swollen. States started 4 weeks ago. Worse end of day, good first thing in am. She denies pain but notes tender. Denies falls and injury. She had this before and was on HCTZ and it resolved. She ran ou of Rx as she cannot afford with husband laid off. She works for [employer] and income limited. Spouse back to work now and should have insurnace next month. She denies cough and  wheezing. She denies apetite chnages, nausea, vomitting, diarrhea and constipaiton and bloody stools.   She has bee anxious with all that is happening. States husband losing job causes fincial strain. Anxious at times. Not sure chest pain associated. Denis suicidal thought and need for Rx sighting back on track now with husband, new job and health insurance kicking back in next month.  She now presents.    Hypertension History:      She denies headache, chest pain, palpitations, dyspnea with exertion, orthopnea, PND, peripheral edema, visual symptoms, neurologic problems, syncope, and side effects from treatment.  Further comments include: Out of Rx. See HPI.        Positive major cardiovascular risk factors include hyperlipidemia and hypertension.  Negative major cardiovascular risk factors include female age less than 29 years old, no history of diabetes, negative family history for ischemic heart disease, and non-tobacco-user status.        Further assessment for target organ damage reveals no history of ASHD, stroke/TIA, or peripheral vascular disease.    Lipid Management History:      Positive NCEP/ATP III risk factors include hypertension.  Negative NCEP/ATP III risk factors include female age less than 50 years old, no history of early menopause without estrogen hormone replacement, non-diabetic,  no family history for ischemic heart disease, non-tobacco-user status, no ASHD (atherosclerotic heart disease), no prior stroke/TIA, no peripheral vascular disease, and no history of aortic aneurysm.        The patient states that she does not know about the "Therapeutic Lifestyle Change" diet.  Her compliance with the TLC diet is fair.      Current Problems (verified): 1)  Knee Pain, Right  (ICD-719.46) 2)  Asthma  (ICD-493.90) 3)  Breast Mass, Right  (ICD-611.72) 4)  Tinea Corporis  (ICD-110.5) 5)  Plantar Fasciitis, Right  (ICD-728.71) 6)  Shoulder Pain, Left  (ICD-719.41) 7)  Essential  Hypertension, Benign  (ICD-401.1) 8)  Excessive Menstruation  (ICD-626.2) 9)  Anemia, Iron Deficiency  (ICD-280.9) 10)  Hyperlipidemia  (ICD-272.4) 11)  Malaise and Fatigue  (ICD-780.79) 12)  Morbid Obesity  (ICD-278.01) 13)  Gerd  (ICD-530.81)  Current Medications (verified): 1)  Carvedilol 6.25 Mg  Tabs (Carvedilol) .... Two Times A Day 2)  Hydrochlorothiazide 25 Mg  Tabs (Hydrochlorothiazide) .... One Daily  Allergies (verified): No Known Drug Allergies  Social History: Married Never Smoked Alcohol use-no Drug use-no Aide for [employer] Lives wit spouse.  Review of Systems      See HPI  Physical Exam  General:  Well-developed,well-nourished,in no acute distress; alert,appropriate and cooperative throughout examination Chest Wall:  Pain over costochonral junctions. Breasts:  Right breast surface shows 3 cm lateral of 9 o'clock on areola a small 5 mm slightly hyperpifmented area. States was bright red andnow fading. No mass felt. Lungs:  Normal respiratory effort, chest expands symmetrically. Lungs are clear to auscultation, no crackles or wheezes. Heart:  Normal rate and regular rhythm. S1 and S2 normal without gallop, murmur, click, rub or other extra sounds. Abdomen:  Bowel sounds positive,abdomen soft and non-tender without masses, organomegaly or hernias noted. Extremities:  Trace ankle edema left and right   Impression & Recommendations:  Problem # 1:  FOLLICULITIS (ICD-704.8) Discussed. Resolving. Start abx ointment two times a day and update if worsen or does not resolve in a week. Councelled hx of right breast mass and due for repeat mammo now. She cannot afford this without insurance. Asked to call [facility] and see what assisatnce they can offer. If none, get mammo December 25, 2008 when insurance kicks in. Agreeable.  Problem # 2:  LEG EDEMA, BILATERAL (ICD-782.3) High BP and not taking Rx. Resume. Recheck 4 weeks - sooner if worse. Her updated  medication list for this problem includes:    Hydrochlorothiazide 25 Mg Tabs (Hydrochlorothiazide) ..... One daily  Problem # 3:  CHEST PAIN (ICD-786.50)  Suspect costochonditis. Reproducable on exam. Start Aleve 220mg  two two times a day for one week. Update if worsen or persst. EKG done and no acute findings. Again, if worsen,update immediately.   Orders: EKG w/ Interpretation (93000)  Complete Medication List: 1)  Carvedilol 6.25 Mg Tabs (Carvedilol) .... Two times a day 2)  Hydrochlorothiazide 25 Mg Tabs (Hydrochlorothiazide) .... One daily  Hypertension Assessment/Plan:      The patient's hypertensive risk group is category B: At least one risk factor (excluding diabetes) with no target organ damage.  Her calculated 10 year risk of coronary heart disease is 8 %.  Today's blood pressure is 152/87.  Her blood pressure goal is < 140/90.  Lipid Assessment/Plan:      Based on NCEP/ATP III, the patient's risk factor category is "0-1 risk factors".  The patient's lipid goals are as follows: Total cholesterol  goal is 200; LDL cholesterol goal is 160; HDL cholesterol goal is 40; Triglyceride goal is 150.    Patient Instructions: 1)  Please schedule a follow-up appointment first week in July for routine care. Sooner if symptoms worsen. Prescriptions: HYDROCHLOROTHIAZIDE 25 MG  TABS (HYDROCHLOROTHIAZIDE) One daily  #30 x 3   Entered and Authorized by:   Franchot Heidelberg MD   Signed by:   Franchot Heidelberg MD on 12/05/2008   Method used:   Print then Give to Patient   RxID:   [id number] CARVEDILOL 6.25 MG  TABS (CARVEDILOL) two times a day  #60 x 3   Entered and Authorized by:   Franchot Heidelberg MD   Signed by:   Franchot Heidelberg MD on 12/05/2008   Method used:   Print then Give to Patient   RxID:   [id number]        EKG  Procedure date:  12/05/2008  Findings:      abnormal:  HR 67 Voltage criteria for LVH No acute findings

## 2010-07-27 NOTE — Assessment & Plan Note (Signed)
Summary: Acute visit - ?pink eye/arc   Vital Signs:  Patient profile:   50 year old female Height:      67.5 inches Weight:      281 pounds BMI:     43.52 O2 Sat:      96 % Temp:     97.8 degrees F Pulse rate:   73 / minute Resp:     14 per minute BP sitting:   130 / 87  Vitals Entered By: Sherilyn Banker LPN (December 12, 2008 3:24 PM) CC: R eye red and painful since this a.m. Nutritional Status BMI of > 30 = obese   Primary Provider:  Franchot Heidelberg, MD  CC:  R eye red and painful since this a.m..  History of Present Illness: Pt in for acute visit.  She woke up this morning with red right eye. She denies crusting and notes just milldy uncomfortable - rates 1/10. She denies blurry vision, double vision. She states eye not to watery. She has not had sensitivity to light. Deies trauma.  Denies ill contacts. She denies runny nose, sore throat. She denies ear pain. She has not had cough or wheeze. She denies hx of gloucoma. Has not had eye exam in good while - 3 years or so. She has not self treated. She does wear reading glasses.  No report of fever, chills or sweats.  She now presents.   Current Problems (verified): 1)  Leg Edema, Bilateral  (ICD-782.3) 2)  Chest Pain  (ICD-786.50) 3)  Folliculitis  (ICD-704.8) 4)  Knee Pain, Right  (ICD-719.46) 5)  Asthma  (ICD-493.90) 6)  Breast Mass, Right  (ICD-611.72) 7)  Tinea Corporis  (ICD-110.5) 8)  Plantar Fasciitis, Right  (ICD-728.71) 9)  Shoulder Pain, Left  (ICD-719.41) 10)  Essential Hypertension, Benign  (ICD-401.1) 11)  Excessive Menstruation  (ICD-626.2) 12)  Anemia, Iron Deficiency  (ICD-280.9) 13)  Hyperlipidemia  (ICD-272.4) 14)  Malaise and Fatigue  (ICD-780.79) 15)  Morbid Obesity  (ICD-278.01) 16)  Gerd  (ICD-530.81)  Current Medications (verified): 1)  Carvedilol 6.25 Mg  Tabs (Carvedilol) .... Two Times A Day 2)  Hydrochlorothiazide 25 Mg  Tabs (Hydrochlorothiazide) .... One Daily  Allergies: No Known Drug  Allergies  Review of Systems      See HPI  Physical Exam  General:  Well-developed,well-nourished,in no acute distress; alert,appropriate and cooperative throughout examination Head:  Normocephalic and atraumatic without obvious abnormalities. No apparent alopecia or balding. Eyes:  PERRLA. Normal EOM. Pinguecul noted. Right eye medial eye is injected with salmon pink color. Entails conjunctiva only.  Ears:  External ear exam shows no significant lesions or deformities.  Otoscopic examination reveals clear canals, tympanic membranes are intact bilaterally without bulging, retraction, inflammation or discharge. Hearing is grossly normal bilaterally. Nose:  External nasal examination shows no deformity or inflammation. Nasal mucosa are pink and moist without lesions or exudates. Mouth:  Oral mucosa and oropharynx without lesions or exudates.  Teeth in good repair. Lungs:  Normal respiratory effort, chest expands symmetrically. Lungs are clear to auscultation, no crackles or wheezes. Heart:  Normal rate and regular rhythm. S1 and S2 normal without gallop, murmur, click, rub or other extra sounds.   Impression & Recommendations:  Problem # 1:  CONJUNCTIVITIS - RIGHT (ICD-372.30) Disussed. Visual acuity done - left 20/50, right 20/40, bilateral 20/40 without correction.  Start gentamyci eye drops as directed. Advsied risk and benefit and metho of use. Urged yearly eye exams with dx of HTN. If eye symptom worsen,  go to ED as weekend here. Dicussed diff including gloucoma, etc. Agrees. Councelled infection control measures and advised highly contagious state. Councelled risk and benefit of abx use. Her updated medication list for this problem includes:    Gentamicin Sulfate 0.3 % Soln (Gentamicin sulfate) .Marland Kitchen... 1-2 drops to right eye every 4 hours  for 5 days.  Complete Medication List: 1)  Carvedilol 6.25 Mg Tabs (Carvedilol) .... Two times a day 2)  Hydrochlorothiazide 25 Mg Tabs  (Hydrochlorothiazide) .... One daily 3)  Gentamicin Sulfate 0.3 % Soln (Gentamicin sulfate) .Marland Kitchen.. 1-2 drops to right eye every 4 hours  for 5 days. Prescriptions: GENTAMICIN SULFATE 0.3 % SOLN (GENTAMICIN SULFATE) 1-2 drops to right eye every 4 hours  for 5 days.  #QS x 0   Entered and Authorized by:   Franchot Heidelberg MD   Signed by:   Franchot Heidelberg MD on 12/12/2008   Method used:   Print then Give to Patient   RxID:   [id number]

## 2010-07-27 NOTE — Assessment & Plan Note (Signed)
Summary: follow up ER visit/cnd   Vital Signs:  Patient Profile:   50 Years Old Female Height:     67.5 inches Weight:      284 pounds BMI:     43.98 O2 Sat:      100 % Pulse rate:   85 / minute Resp:     14 per minute BP sitting:   134 / 81  Vitals Entered By: Sherilyn Banker (April 25, 2008 1:08 PM)                 PCP:  Franchot Heidelberg, MD  Chief Complaint:  ER follow up .  History of Present Illness: Pt in for recheck.  She was just seen at [facility] ED and was admitted for 24 hour observtion in lieu of asthma exacerbation. She had been dx 2 years ago with same and has not been treated and she never reported hx here. She has not used Rx in two years and states just hit her.  She notes she did well with Rx overnight.  She was given RX and signs resolved in 24 hours. She was started on Advair and Albuterol MDI. She has used the latter once yesterday. She denies cough and states soem at night. Noites occasional wheezing. She deneis chest pain, SOB, orthopnea, PND and palpitations. She denies leg swelling. Notes no fever, chills or sweats.  She has a good apetite. Denies nausea an vomitting. Denies diarrhea.   She notes she feels good.  She has not had a pnuemonia vaccine. SHe is not open to peakflows and is well aware of this and its benefit.  Now presents.   Hypertension History:      She denies headache, chest pain, palpitations, dyspnea with exertion, orthopnea, PND, peripheral edema, visual symptoms, neurologic problems, syncope, and side effects from treatment.  She notes no problems with any antihypertensive medication side effects.        Positive major cardiovascular risk factors include hyperlipidemia and hypertension.  Negative major cardiovascular risk factors include female age less than 84 years old, no history of diabetes, negative family history for ischemic heart disease, and non-tobacco-user status.        Further assessment for target organ damage reveals no  history of ASHD, stroke/TIA, or peripheral vascular disease.       Prior Medications Reviewed Using: Patient Recall  Updated Prior Medication List: FERROUS SULFATE 325 (65 FE) MG TABS (FERROUS SULFATE) two times a day CARVEDILOL 6.25 MG  TABS (CARVEDILOL) two times a day HYDROCHLOROTHIAZIDE 25 MG  TABS (HYDROCHLOROTHIAZIDE) One daily NYSTATIN  EXT POWD (NYSTATIN) Apply to breast folds and lower back two times a day for 14 days. PROVERA 10 MG TABS (MEDROXYPROGESTERONE ACETATE) One daily for 5 to 10 days or until bleeding stops.  Current Allergies (reviewed today): No known allergies   Past Medical History:    Reviewed history from 12/21/2007 and no changes required:       Current Problems:        SHOULDER PAIN, LEFT (ICD-719.41)       ESSENTIAL HYPERTENSION, BENIGN (ICD-401.1)       EXCESSIVE MENSTRUATION (ICD-626.2)       ANEMIA, IRON DEFICIENCY (ICD-280.9)       HYPERLIPIDEMIA (ICD-272.4)       MALAISE AND FATIGUE (ICD-780.79)       MORBID OBESITY (ICD-278.01)       GERD (ICD-530.81)         Past Surgical History:    Reviewed history  from 09/19/2006 and no changes required:       Appendectomy     Review of Systems      See HPI   Physical Exam  General:     Well-developed,well-nourished,in no acute distress; alert,appropriate and cooperative throughout examination. Obese. Lungs:     Normal respiratory effort, chest expands symmetrically. Lungs are clear to auscultation, no crackles or wheezes. Heart:     Normal rate and regular rhythm. S1 and S2 normal without gallop, murmur, click, rub or other extra sounds.    Impression & Recommendations:  Problem # 1:  ASTHMA (ICD-493.90) New dx. Problems updated. Hold off on Advair given last episode two years ago. Agree with Albuterol for now and will recheck 4 weeks. OIffered peakflow and spirometry and will consider. Udpate pneumovax. Recheck 4 weeks - sooner if worse. Agrees. Her updated medication list for this  problem includes:    Proventil Hfa 108 (90 Base) Mcg/act Aers (Albuterol sulfate) .Marland Kitchen... 2 puffs every 6 hrs as needed   Complete Medication List: 1)  Ferrous Sulfate 325 (65 Fe) Mg Tabs (Ferrous sulfate) .... Two times a day 2)  Carvedilol 6.25 Mg Tabs (Carvedilol) .... Two times a day 3)  Hydrochlorothiazide 25 Mg Tabs (Hydrochlorothiazide) .... One daily 4)  Nystatin Ext Powd (Nystatin) .... Apply to breast folds and lower back two times a day for 14 days. 5)  Provera 10 Mg Tabs (Medroxyprogesterone acetate) .... One daily for 5 to 10 days or until bleeding stops. 6)  Proventil Hfa 108 (90 Base) Mcg/act Aers (Albuterol sulfate) .... 2 puffs every 6 hrs as needed  Other Orders: Pneumococcal Vaccine (16109) Admin 1st Vaccine (60454)  Hypertension Assessment/Plan:      The patient's hypertensive risk group is category B: At least one risk factor (excluding diabetes) with no target organ damage.  Her calculated 10 year risk of coronary heart disease is 6 %.  Today's blood pressure is 134/81.  Her blood pressure goal is < 140/90.   Patient Instructions: 1)  Please schedule a follow-up appointment in 1 month.   ]  Preventive Care Screening  Last Pneumovax:    Date:  04/25/2008    Next Due:  04/2013    Results:  given    Pneumovax Vaccine    Vaccine Type: Pneumovax    Site: left deltoid    Mfr: Merck    Dose: 0.5 ml    Route: IM    Given by: Sherilyn Banker    Exp. Date: 05/27/2009    Lot #: [id number]    VIS given: 01/23/96 version given April 25, 2008.

## 2010-07-27 NOTE — Progress Notes (Signed)
Summary: 06/27/07 lab results  Phone Note Outgoing Call   Call placed by: Sonny Dandy,  June 29, 2007 1:45 PM Summary of Call: results given to patient and she stated her dizziness is better. Initial call taken by: Sonny Dandy,  June 29, 2007 1:46 PM  Follow-up for Phone Call        Noted. Follow-up by: Franchot Heidelberg MD,  June 29, 2007 1:47 PM

## 2010-07-27 NOTE — Assessment & Plan Note (Signed)
Summary: KNOT IN BREAST/ARC   Vital Signs:  Patient Profile:   50 Years Old Female Height:     67.5 inches Weight:      279 pounds BMI:     43.21 O2 Sat:      99 % Temp:     97.8 degrees F Pulse rate:   76 / minute Resp:     12 per minute BP sitting:   141 / 88  Vitals Entered By: Sherilyn Banker (April 10, 2008 2:09 PM)                 PCP:  Franchot Heidelberg, MD  Chief Complaint:  pt found knot on R breast yesterday.  History of Present Illness: Pt in for acute visit.  She states she found a knot in her right breast. She does self exams and states this is new. States below nipple. Not very large. States not to hard. States no pain and does not feel like it moves. She denies breast pain, nipple retraction and nipple discharge. She denies personal hx of breast cancer and notes mom was dx in late twenties with disease. She had a normal mammogram in June and was advised recheck one year. Denies fever, chills and sweats. No hx of breast abscess. Not breast feeding. Denies smoking. Minimal ETOH use. No trauma reported.  She now presents.    Prior Medications Reviewed Using: Patient Recall  Updated Prior Medication List: FERROUS SULFATE 325 (65 FE) MG TABS (FERROUS SULFATE) two times a day CARVEDILOL 6.25 MG  TABS (CARVEDILOL) two times a day HYDROCHLOROTHIAZIDE 25 MG  TABS (HYDROCHLOROTHIAZIDE) One daily NYSTATIN  EXT POWD (NYSTATIN) Apply to breast folds and lower back two times a day for 14 days. PROVERA 10 MG TABS (MEDROXYPROGESTERONE ACETATE) One daily for 5 to 10 days or until bleeding stops.  Current Allergies (reviewed today): No known allergies      Review of Systems      See HPI   Physical Exam  General:     Well-developed,well-nourished,in no acute distress; alert,appropriate and cooperative throughout examination. Obese. Breasts:     No mass, thickening, tenderness, bulging, retraction, inflamation, nipple discharge or skin changes noted.  Her right  brest shows dense tissue adn what appears to be fibroglandular tissue at 7 on nipple edge. Pea size. Mobile, rubbery and moveable. No discreet mass felt. Lungs:     Normal respiratory effort, chest expands symmetrically. Lungs are clear to auscultation, no crackles or wheezes. Heart:     Normal rate and regular rhythm. S1 and S2 normal without gallop, murmur, click, rub or other extra sounds. Cervical Nodes:     No lymphadenopathy noted Axillary Nodes:     No palpable lymphadenopathy    Impression & Recommendations:  Problem # 1:  BREAST MASS, RIGHT (ICD-611.72) Refer dx mammogram and await input. Suspect fibroglandular tissue. Discussed and reassured but advised aggressive eval in lieu of fam hx. Agrees. Optomize psot dx mammo. Agrees. May need to consider surgical consult as well. Orders: Radiology Referral (Radiology)   Complete Medication List: 1)  Ferrous Sulfate 325 (65 Fe) Mg Tabs (Ferrous sulfate) .... Two times a day 2)  Carvedilol 6.25 Mg Tabs (Carvedilol) .... Two times a day 3)  Hydrochlorothiazide 25 Mg Tabs (Hydrochlorothiazide) .... One daily 4)  Nystatin Ext Powd (Nystatin) .... Apply to breast folds and lower back two times a day for 14 days. 5)  Provera 10 Mg Tabs (Medroxyprogesterone acetate) .... One daily for 5 to 10  days or until bleeding stops.    ]   Appended Document: KNOT IN BREAST/ARC Scheduled for Diagnostic Mammo on 19 Oct 09 at 1:00.  Pt needs to register at [facility] center at 12:30.  No lotions, powders or deoderants.  Appended Document: KNOT IN BREAST/ARC Pt advised of the above.

## 2010-07-27 NOTE — Progress Notes (Signed)
Summary: 03/14/07 [place] report  Phone Note Outgoing Call   Call placed by: Sonny Dandy,  March 15, 2007 4:05 PM Summary of Call: called, message left for patient to return call .................................................................Marland KitchenMarland KitchenSonny Dandy  March 15, 2007 4:05 PM   Results given to patient, voices understanding .................................................................Marland KitchenMarland KitchenSonny Dandy  March 16, 2007 1:23 PM   Initial call taken by: Sonny Dandy,  March 16, 2007 1:24 PM

## 2010-07-27 NOTE — Assessment & Plan Note (Signed)
Summary: knot under arm x 2 weeks/just started hurting 10.29.08/arc   Vital Signs:  Patient Profile:   50 Years Old Female Height:     67.5 inches Weight:      283 pounds BMI:     43.83 O2 Sat:      99 % Temp:     97.1 degrees F Pulse rate:   88 / minute Resp:     14 per minute BP sitting:   140 / 82  Vitals Entered By: Sherilyn Banker (April 27, 2007 9:51 AM)                 PCP:  Franchot Heidelberg, MD  Chief Complaint:  Knot under L arm.  History of Present Illness: Pt in for acute visit.  She notes she had a knot come up under her left arm. She states it hurts and is swollen. She rates pain as mild. Worse with pressure. Better when not pushing on it.  SHe had a normal mammogram in May 2008. She denies hx of lesions in armpits. Pain is localized. She denies fever, chills and sweats. She denies ill contacts and travel. No left arm or trunkal lesions noted. Sx for 48 hours.  Now presents.  Hypertension History:      Positive major cardiovascular risk factors include hyperlipidemia and hypertension.  Negative major cardiovascular risk factors include female age less than 3 years old, no history of diabetes, negative family history for ischemic heart disease, and non-tobacco-user status.        Further assessment for target organ damage reveals no history of ASHD, stroke/TIA, or peripheral vascular disease.     Current Allergies (reviewed today): No known allergies   Past Medical History:    Reviewed history from 09/19/2006 and no changes required:       GERD  Past Surgical History:    Reviewed history from 09/19/2006 and no changes required:       Appendectomy   Family History:    Reviewed history from 09/19/2006 and no changes required:       father - hx unknown       mother - breast/ ovarian cancer       4 sisters - 1 with DM       3 children - 2 with Muscular Dystrophy  Social History:    Reviewed history from 09/19/2006 and no changes required:  Married       Never Smoked       Alcohol use-no       Drug use-no    Review of Systems      See HPI   Physical Exam  General:     Well-developed,well-nourished,in no acute distress; alert,appropriate and cooperative throughout examination Chest Wall:     Left chest wall examined. Inferior to axilla is 2 cm solid mass, well defined, smooth, warm and tender to touch with small opening and yellow green puss draining. Advised I&D and agrees. Lungs:     Normal respiratory effort, chest expands symmetrically. Lungs are clear to auscultation, no crackles or wheezes. Heart:     Normal rate and regular rhythm. S1 and S2 normal without gallop, murmur, click, rub or other extra sounds. Abdomen:     Bowel sounds positive,abdomen soft and non-tender without masses, organomegaly or hernias noted. Extremities:     No clubbing, cyanosis, edema, or deformity noted with normal full range of motion of all joints.   Psych:     Cognition and judgment appear intact.  Alert and cooperative with normal attention span and concentration. No apparent delusions, illusions, hallucinations    Impression & Recommendations:  Problem # 1:  ABSCESS, SKIN (ICD-682.9) Discussed. Advised I&D and she is agreeable. Consent signed. Questions answered. Area prepped and draped in sterile fashion. Anasthesia obtained with Lido and Epi 2 % for 2 cc. After this took effect # 11 scalped used to make 1/2 cm incision over point of maximal fluctuatiuon. Tolerated well. Puss drained and C&S done. To be sent to lab after marked with patient name and information. Large amount of puss expressed. Then sebaceous material returned. Area clenased and covered with abx ointment and bandaide. Start Doxy. Advied risk and benefit. Counceleld dx of infected sebaceous cyst. Recheck two weeks and optomize with possible excision. Agrees. Her updated medication list for this problem includes:    Doxy-caps 100 Mg Caps (Doxycycline hyclate) ..... One  two times a day with plenty of food and water  Orders: I&D Abscess, Simple / Single (10060)   Complete Medication List: 1)  Ferrous Sulfate 325 (65 Fe) Mg Tabs (Ferrous sulfate) .... Two times a day 2)  Ec-naprosyn 500 Mg Tbec (Naproxen) .... Two times a day 3)  Doxy-caps 100 Mg Caps (Doxycycline hyclate) .... One two times a day with plenty of food and water  Hypertension Assessment/Plan:      The patient's hypertensive risk group is category B: At least one risk factor (excluding diabetes) with no target organ damage.  Her calculated 10 year risk of coronary heart disease is 7 %.  Today's blood pressure is 140/82.  Her blood pressure goal is < 140/90.   Patient Instructions: 1)  Please schedule a follow-up appointment in 2 weeks.    Prescriptions: DOXY-CAPS 100 MG  CAPS (DOXYCYCLINE HYCLATE) One two times a day with plenty of food and water  #20 x 0   Entered and Authorized by:   Franchot Heidelberg MD   Signed by:   Franchot Heidelberg MD on 04/27/2007   Method used:   Print then Give to Patient   RxID:   [email or web address]  ]  Appended Document: Orders Update    Clinical Lists Changes  Orders: Added new Test order of T-Culture, Wound [phone]) - Signed

## 2010-07-27 NOTE — Assessment & Plan Note (Signed)
Summary: 6 WEEK FOLLOW UP/ARC   Vital Signs:  Patient Profile:   50 Years Old Female Height:     67.5 inches Weight:      288 pounds BMI:     44.60 O2 Sat:      97 % Temp:     97.8 degrees F Pulse rate:   85 / minute Resp:     14 per minute BP sitting:   135 / 87  Vitals Entered By: Sherilyn Banker (September 14, 2007 3:15 PM)                 PCP:  Franchot Heidelberg, MD  Chief Complaint:  follow up visit.  History of Present Illness: Pt in for recheck.  She has left shoulder pain and notes easing off. She rates pain as down to 2/10. She states hard to lift and but getting a lot better slowly but surely. She denies weakness and notes no numbness or tingling. She denies injury and is right hand dominant. She is doing PT and has 3 weeks left on this. Uses Aleve as needed only  - perhaps once a month.States Hates taking pills and only uses when really hurts.  She denies further dizzyness. Has not had headaches and states knows fluid pill to blame. No tremors or weakness noted.   Her BP med is doing well. No chest pain, SOB, orthopnea, PND and ankle edmea. She has not had recent eye exam. Has seen dentist and notes saw Nov 2008. Set to see in April.   She is working diet. SHe is eating healthier. She is not exersizing as she should. With Spring in air states will do more.  Now presents.    Hypertension History:      She denies headache, chest pain, palpitations, dyspnea with exertion, orthopnea, PND, peripheral edema, visual symptoms, neurologic problems, syncope, and side effects from treatment.  She notes no problems with any antihypertensive medication side effects.        Positive major cardiovascular risk factors include hyperlipidemia and hypertension.  Negative major cardiovascular risk factors include female age less than 16 years old, no history of diabetes, negative family history for ischemic heart disease, and non-tobacco-user status.        Further assessment for target  organ damage reveals no history of ASHD, stroke/TIA, or peripheral vascular disease.    Lipid Management History:      Positive NCEP/ATP III risk factors include hypertension.  Negative NCEP/ATP III risk factors include female age less than 67 years old, no history of early menopause without estrogen hormone replacement, non-diabetic, no family history for ischemic heart disease, non-tobacco-user status, no ASHD (atherosclerotic heart disease), no prior stroke/TIA, no peripheral vascular disease, and no history of aortic aneurysm.        The patient states that she knows about the "Therapeutic Lifestyle Change" diet.  Her compliance with the TLC diet is fair.  The patient expresses understanding of adjunctive measures for cholesterol lowering.  Adjunctive measures started by the patient include aerobic exercise, fiber, ASA, and omega-3 supplements.        Prior Medications Reviewed Using: Patient Recall  Updated Prior Medication List: FERROUS SULFATE 325 (65 FE) MG TABS (FERROUS SULFATE) two times a day EC-NAPROSYN 500 MG  TBEC (NAPROXEN) two times a day AMLODIPINE BESYLATE 5 MG  TABS (AMLODIPINE BESYLATE) One daily MECLIZINE HCL 25 MG  TABS (MECLIZINE HCL) One every 6 hours as needed  Current Allergies (reviewed today): No known allergies  Past Medical History:    Reviewed history from 09/19/2006 and no changes required:       GERD  Past Surgical History:    Reviewed history from 09/19/2006 and no changes required:       Appendectomy   Family History:    Reviewed history from 09/19/2006 and no changes required:       father - hx unknown       mother - breast/ ovarian cancer       4 sisters - 1 with DM       3 children - 2 with Muscular Dystrophy  Social History:    Reviewed history from 09/19/2006 and no changes required:       Married       Never Smoked       Alcohol use-no       Drug use-no   Risk Factors: Tobacco use:  never Drug use:  no Alcohol use:   no  Family History Risk Factors:    Family History of MI in females < 28 years old:  no    Family History of MI in males < 58 years old:  no  Mammogram History:    Date of Last Mammogram:  11/06/2006  PAP Smear History:    Date of Last PAP Smear:  10/18/2006   Review of Systems      See HPI   Physical Exam  General:     Well-developed,well-nourished,in no acute distress; alert,appropriate and cooperative throughout examination Lungs:     Normal respiratory effort, chest expands symmetrically. Lungs are clear to auscultation, no crackles or wheezes. Heart:     Normal rate and regular rhythm. S1 and S2 normal without gallop, murmur, click, rub or other extra sounds. Abdomen:     Bowel sounds positive,abdomen soft and non-tender without masses, organomegaly or hernias noted. Extremities:     No clubbing, cyanosis, edema, or deformity noted with normal full range of motion of all joints.  Legft shoulde stiffness - cannot reach all the way above head or behind her. States shoulder feels stiff and aches. MUch improved vs last eval though with ease of movement today. Cervical Nodes:     No lymphadenopathy noted Psych:     Cognition and judgment appear intact. Alert and cooperative with normal attention span and concentration. No apparent delusions, illusions, hallucinations    Impression & Recommendations:  Problem # 1:  SHOULDER PAIN, LEFT (ICD-719.41) Discussed. Cont PT. Must use NSAID more often and agrees to try Aleve two times a day for the next ten days with food. If sx persist after 3 more weeks of Rx, refer Ortho. Agrees. Her updated medication list for this problem includes:    Ec-naprosyn 500 Mg Tbec (Naproxen) .Marland Kitchen..Marland Kitchen Two times a day   Problem # 2:  ESSENTIAL HYPERTENSION, BENIGN (ICD-401.1) Stable. Rx as below. TLC amust. Enecouraged eye exam and outlined reasons for this. Her updated medication list for this problem includes:    Amlodipine Besylate 5 Mg Tabs  (Amlodipine besylate) ..... One daily  Orders: T-Comprehensive Metabolic Panel ([id number])   Problem # 3:  MORBID OBESITY (ICD-278.01) Down one pound since last visit. Advised diet, exersize and portion control. Recheck 3 months.  Problem # 4:  ANEMIA, IRON DEFICIENCY (ICD-280.9) Iron as is. Repeat labs and optomize in 3 months - sooner if any concern. Agrees. Her updated medication list for this problem includes:    Ferrous Sulfate 325 (65 Fe) Mg Tabs (Ferrous sulfate) .Marland Kitchen..Marland Kitchen Two times  a day  Orders: T-CBC w/Diff ([id number])   Complete Medication List: 1)  Ferrous Sulfate 325 (65 Fe) Mg Tabs (Ferrous sulfate) .... Two times a day 2)  Ec-naprosyn 500 Mg Tbec (Naproxen) .... Two times a day 3)  Amlodipine Besylate 5 Mg Tabs (Amlodipine besylate) .... One daily  Other Orders: T-Lipid Profile ([id number]) T-TSH [phone])  Hypertension Assessment/Plan:      The patient's hypertensive risk group is category B: At least one risk factor (excluding diabetes) with no target organ damage.  Her calculated 10 year risk of coronary heart disease is 5 %.  Today's blood pressure is 135/87.  Her blood pressure goal is < 140/90.  Lipid Assessment/Plan:      Based on NCEP/ATP III, the patient's risk factor category is "0-1 risk factors".  From this information, the patient's calculated lipid goals are as follows: Total cholesterol goal is 200; LDL cholesterol goal is 160; HDL cholesterol goal is 40; Triglyceride goal is 150.     Patient Instructions: 1)  Please schedule a follow-up appointment in 3 months.    ]

## 2010-07-27 NOTE — Progress Notes (Signed)
Summary: 10/27/07 lab results  Phone Note Outgoing Call   Call placed by: Sonny Dandy,  Oct 29, 2007 3:16 PM Summary of Call: Results given to patient, voices understanding   Initial call taken by: Sonny Dandy,  Oct 29, 2007 3:16 PM

## 2010-07-27 NOTE — Assessment & Plan Note (Signed)
Summary: cpe and pap/arc   Vital Signs:  Patient Profile:   50 Years Old Female Height:     67.5 inches Weight:      286.7 pounds O2 Sat:      100 % Temp:     97.1 degrees F oral Pulse rate:   66 / minute Resp:     15 per minute BP sitting:   148 / 89  (left arm)  Pt. in pain?   no  Vitals Entered BySonny Dandy (October 17, 2006 3:39 PM) Oxygen therapy Room Air                PCP:  Franchot Heidelberg, MD  Chief Complaint:  cpe & pap.  History of Present Illness: Pt in today for well woman exam.  She had lab on her last visit:  1. CBC: HGB 10.7 with MCV 80s. She is still having periods - notes they are very heavy first few days. She denies blood in stool. 2. CMP - normal 3. TSH normal 4. Lipids: See below.  She has not had a female exam in a while. She denies breast pain, nipple retraction and discharge. SHe started periods at 13. Her last period was about April 5. She notes some dysmenorhrea on day 1. SHe is G3P3M0A0. She did use OCP in her 54s for a few years. No hx of HRT. She did breast feed. She does not smoke. Her mom had breast cancer at age 57. Pt hs never had mamogram. She does self exams - no lesions felt. She denies vaginal discharge. No discharge. Single partner.  No hx of STDS. Notes no hx of abnormal pap-smears.  Now presents.  Lipid Management History:      Negative NCEP/ATP III risk factors include female age less than 48 years old, no history of early menopause without estrogen hormone replacement, non-diabetic, no family history for ischemic heart disease, non-tobacco-user status, no ASHD (atherosclerotic heart disease), no prior stroke/TIA, no peripheral vascular disease, and no history of aortic aneurysm.     Current Allergies (reviewed today): No known allergies   Past Medical History:    Reviewed history from 09/19/2006 and no changes required:       GERD  Past Surgical History:    Reviewed history from 09/19/2006 and no changes required:     Appendectomy   Family History:    Reviewed history from 09/19/2006 and no changes required:       father - hx unknown       mother - breast/ ovarian cancer       4 sisters - 1 with DM       3 children - 2 with Muscular Dystrophy  Social History:    Reviewed history from 09/19/2006 and no changes required:       Married       Never Smoked       Alcohol use-no       Drug use-no    Review of Systems      See HPI   Physical Exam  General:     Well-developed,well-nourished,in no acute distress; alert,appropriate and cooperative throughout examination Breasts:     No mass, nodules, thickening, tenderness, bulging, retraction, inflamation, nipple discharge or skin changes noted.   Lungs:     Normal respiratory effort, chest expands symmetrically. Lungs are clear to auscultation, no crackles or wheezes. Heart:     Normal rate and regular rhythm. S1 and S2 normal without gallop, murmur, click, rub  or other extra sounds. Abdomen:     Bowel sounds positive,abdomen soft and non-tender without masses, organomegaly or hernias noted. Genitalia:     Normal introitus for age, no external lesions, note thick white vaginal discharge, mucosa pink and moist, no vaginal or cervical lesions, no vaginal atrophy, note cervix extremely brittle with easy bleeding, normal uterus size and position, no adnexal masses or tenderness Msk:     No deformity or scoliosis noted of thoracic or lumbar spine.   Pulses:     R and L carotid,radial,femoral,dorsalis pedis and posterior tibial pulses are full and equal bilaterally Extremities:     No clubbing, cyanosis, edema, or deformity noted with normal full range of motion of all joints.   Cervical Nodes:     No lymphadenopathy noted Axillary Nodes:     No palpable lymphadenopathy Psych:     Cognition and judgment appear intact. Alert and cooperative with normal attention span and concentration. No apparent delusions, illusions,  hallucinations    Impression & Recommendations:  Problem # 1:  ANEMIA, NORMOCYTIC (ICD-285.9) Discussed. Possibly related to heavy periods. Check labs as below. Consider Pelvic ultrasound to look for bleeding causes and consider colonoscopy if abnormal without other cause. Orders: T- * Misc. Laboratory test [phone])   Problem # 2:  HYPERLIPIDEMIA (ICD-272.4) Lipids reviewed. At goal. Diet, exersize and low salt intake a must.  Problem # 3:  MORBID OBESITY (ICD-278.01) Weight loss a must. Refer dietary for education and start walking: 20 minutes 3 to 4 times weekly. Recheck 6 weeks.  Problem # 4:  ELEVATED BLOOD PRESSURE WITHOUT DIAGNOSIS OF HYPERTENSION (ICD-796.2) Patient anxious about female exam today. Will recheck 6 weeks and optomize.  Problem # 5:  WELL WOMAN (ICD-V70.0) Reviewed. Await pap and vaginal discharge eval. Refer [facility]. Councelled diet,exersize and low salt intake. Advised yearly eye exam, dental care and need for MVI. Stressed importance of Oscal and D or same for bone health. Pt agrees.  Problem # 6:  GERD (ICD-530.81) Chest pain gone since starting Aciphex. Switch to OTC Prilosec and loose weight. TLC a must. Pt agrees.  Lipid Assessment/Plan:      Based on NCEP/ATP III, the patient's risk factor category is "0-1 risk factors".  From this information, the patient's calculated lipid goals are as follows: Total cholesterol goal is 200; LDL cholesterol goal is 160; HDL cholesterol goal is 40; Triglyceride goal is 150.     Patient Instructions: 1)  Please schedule a follow-up appointment in 6 weeks. Sooner if needed.  Appended Document: cpe and pap/arc Mammo scheduled for 11/06/06 @12 :30pm at [facility]  Appended Document: cpe and pap/arc Order sent to [facility] for evaluation

## 2010-07-27 NOTE — Letter (Signed)
Summary: occupational therapy  occupational therapy   Imported By: Curtis Sites 09/18/2007 09:53:34  _____________________________________________________________________  External Attachment:    Type:   Image     Comment:   External Document

## 2010-07-27 NOTE — Assessment & Plan Note (Signed)
Summary: 6 WEEK FOLLOW UP/ARC   Vital Signs:  Patient Profile:   50 Years Old Female Height:     67.5 inches Weight:      277 pounds BMI:     42.90 O2 Sat:      98 % Temp:     97.9 degrees F Pulse rate:   88 / minute Resp:     12 per minute BP sitting:   132 / 78  Vitals Entered By: Sherilyn Banker (March 14, 2008 8:59 AM)                 PCP:  Franchot Heidelberg, MD  Chief Complaint:  follow up visit.  History of Present Illness: Pt in for recheck.  She notes doing well.  She states she started Nystatin Powder for tiena corporis and signs cleared. She states she started back on Megace as prescribed by Dr. Emelda Fear for dysfucntional bleeding. She states once she started  this signs recur. She states she decided to stop Rx and rach has cleared again but now period there for last three weeks. She states not to heavy - spoitty and there and she is tired of this. She is not set to see Dr. Emelda Fear until Nov 2009. She denies pelvic pain, fever, chills and sweats. She has not had urinary signs. She called his office lat Friday to let them know but never got call back. She states will call again but hopeful I can help her.  She now presents.    Hypertension History:      She denies headache, chest pain, palpitations, dyspnea with exertion, orthopnea, PND, peripheral edema, visual symptoms, neurologic problems, syncope, and side effects from treatment.  She notes no problems with any antihypertensive medication side effects.  Further comments include: Taking medication daily.Needs refill on Rx. Working on diet and exersize and wegt down 5 pounds - mainly portion control.        Positive major cardiovascular risk factors include hyperlipidemia and hypertension.  Negative major cardiovascular risk factors include female age less than 88 years old, no history of diabetes, negative family history for ischemic heart disease, and non-tobacco-user status.        Further assessment for target  organ damage reveals no history of ASHD, stroke/TIA, or peripheral vascular disease.    Lipid Management History:      Positive NCEP/ATP III risk factors include hypertension.  Negative NCEP/ATP III risk factors include female age less than 80 years old, no history of early menopause without estrogen hormone replacement, non-diabetic, no family history for ischemic heart disease, non-tobacco-user status, no ASHD (atherosclerotic heart disease), no prior stroke/TIA, no peripheral vascular disease, and no history of aortic aneurysm.        The patient states that she knows about the "Therapeutic Lifestyle Change" diet.  Her compliance with the TLC diet is fair.  The patient expresses understanding of adjunctive measures for cholesterol lowering.  Adjunctive measures started by the patient include aerobic exercise and fiber.        Prior Medications Reviewed Using: Patient Recall  Updated Prior Medication List: FERROUS SULFATE 325 (65 FE) MG TABS (FERROUS SULFATE) two times a day CARVEDILOL 6.25 MG  TABS (CARVEDILOL) two times a day HYDROCHLOROTHIAZIDE 25 MG  TABS (HYDROCHLOROTHIAZIDE) One daily NYSTATIN  EXT POWD (NYSTATIN) Apply to breast folds and lower back two times a day for 14 days.  Current Allergies (reviewed today): No known allergies   Past Medical History:    Reviewed history  from 12/21/2007 and no changes required:       Current Problems:        SHOULDER PAIN, LEFT (ICD-719.41)       ESSENTIAL HYPERTENSION, BENIGN (ICD-401.1)       EXCESSIVE MENSTRUATION (ICD-626.2)       ANEMIA, IRON DEFICIENCY (ICD-280.9)       HYPERLIPIDEMIA (ICD-272.4)       MALAISE AND FATIGUE (ICD-780.79)       MORBID OBESITY (ICD-278.01)       GERD (ICD-530.81)         Past Surgical History:    Reviewed history from 09/19/2006 and no changes required:       Appendectomy   Family History:    Reviewed history from 09/19/2006 and no changes required:       father - hx unknown       mother -  breast/ ovarian cancer       4 sisters - 1 with DM       3 children - 2 with Muscular Dystrophy  Social History:    Reviewed history from 09/19/2006 and no changes required:       Married       Never Smoked       Alcohol use-no       Drug use-no   Risk Factors: Tobacco use:  never Drug use:  no Alcohol use:  no  Family History Risk Factors:    Family History of MI in females < 24 years old:  no    Family History of MI in males < 29 years old:  no  Mammogram History:    Date of Last Mammogram:  11/27/2007  PAP Smear History:    Date of Last PAP Smear:  10/18/2006   Review of Systems      See HPI  General      Denies chills, loss of appetite, and sweats.  Resp      Denies cough, shortness of breath, sputum productive, and wheezing.  GI      Denies abdominal pain, constipation, diarrhea, nausea, and vomiting.  GU      Denies nocturia, urinary frequency, and urinary hesitancy.   Physical Exam  General:     Well-developed,well-nourished,in no acute distress; alert,appropriate and cooperative throughout examination. Obese. Lungs:     Normal respiratory effort, chest expands symmetrically. Lungs are clear to auscultation, no crackles or wheezes. Heart:     Normal rate and regular rhythm. S1 and S2 normal without gallop, murmur, click, rub or other extra sounds. Abdomen:     Bowel sounds positive,abdomen soft and non-tender without masses, organomegaly or hernias noted. Extremities:     No clubbing, cyanosis, edema, or deformity noted with normal full range of motion of all joints.   Psych:     Cognition and judgment appear intact. Alert and cooperative with normal attention span and concentration. No apparent delusions, illusions, hallucinations    Impression & Recommendations:  Problem # 1:  TINEA CORPORIS (ICD-110.5) Discussed. States seems related to Megace use and hence does not want to take. Will DC and optomize as below.  Problem # 2:  ESSENTIAL  HYPERTENSION, BENIGN (ICD-401.1) Stable. Rx as below. TLC a must. Check renal fucntion and electrolytes. Limit salt and lose weight. Her updated medication list for this problem includes:    Carvedilol 6.25 Mg Tabs (Carvedilol) .Marland Kitchen..Marland Kitchen Two times a day    Hydrochlorothiazide 25 Mg Tabs (Hydrochlorothiazide) ..... One daily  Orders: T-Basic Metabolic Panel [id number]) Venipuncture (  [id number])   Problem # 3:  EXCESSIVE MENSTRUATION (ICD-626.2) DC Megace. Trial provera. Offered referal back to GYN but declines as states will call him herslef and get in. Advised risk and benefit of Provera use. Update if any cocnerns. Her updated medication list for this problem includes:    Provera 10 Mg Tabs (Medroxyprogesterone acetate) ..... One daily for 5 to 10 days or until bleeding stops.   Problem # 4:  ANEMIA, IRON DEFICIENCY (ICD-280.9) Secondary to above. Repeat CBC and cont iron. Await GYN input. Her updated medication list for this problem includes:    Ferrous Sulfate 325 (65 Fe) Mg Tabs (Ferrous sulfate) .Marland Kitchen..Marland Kitchen Two times a day  Orders: T-CBC w/Diff ([id number]) Venipuncture ([id number])   Problem # 5:  MORBID OBESITY (ICD-278.01) Happy with weight loss and encouraged diet, exersize and portion control. Advised "eat to live and not live to eat". Agrees.  Problem # 6:  Preventive Health Care (ICD-V70.0) Upate flu shot related to son with Muscular dystophy. Advised risk and benefit and councelled immediate update if concern. VIS given.  Complete Medication List: 1)  Ferrous Sulfate 325 (65 Fe) Mg Tabs (Ferrous sulfate) .... Two times a day 2)  Carvedilol 6.25 Mg Tabs (Carvedilol) .... Two times a day 3)  Hydrochlorothiazide 25 Mg Tabs (Hydrochlorothiazide) .... One daily 4)  Nystatin Ext Powd (Nystatin) .... Apply to breast folds and lower back two times a day for 14 days. 5)  Provera 10 Mg Tabs (Medroxyprogesterone acetate) .... One daily for 5 to 10 days or until bleeding stops.  Other  Orders: Influenza Vaccine NON MCR ([id number])  Hypertension Assessment/Plan:      The patient's hypertensive risk group is category B: At least one risk factor (excluding diabetes) with no target organ damage.  Her calculated 10 year risk of coronary heart disease is 6 %.  Today's blood pressure is 132/78.  Her blood pressure goal is < 140/90.  Lipid Assessment/Plan:      Based on NCEP/ATP III, the patient's risk factor category is "0-1 risk factors".  From this information, the patient's calculated lipid goals are as follows: Total cholesterol goal is 200; LDL cholesterol goal is 160; HDL cholesterol goal is 40; Triglyceride goal is 150.     Patient Instructions: 1)  Please schedule a follow-up appointment in 3 months.   Prescriptions: PROVERA 10 MG TABS (MEDROXYPROGESTERONE ACETATE) One daily for 5 to 10 days or until bleeding stops.  #10 x 0   Entered and Authorized by:   Franchot Heidelberg MD   Signed by:   Franchot Heidelberg MD on 03/14/2008   Method used:   Print then Give to Patient   RxID:   [id number]  ]  Influenza Vaccine    Vaccine Type: Fluvax Non-MCR    Site: left deltoid    Mfr: GlaxoSmithKline    Dose: 0.5 ml    Route: IM    Given by: Sherilyn Banker    Exp. Date: 12/24/2008    Lot #: [id number]    VIS given: 01/18/07 version given March 14, 2008.  Flu Vaccine Consent Questions    Do you have a history of severe allergic reactions to this vaccine? no    Any prior history of allergic reactions to egg and/or gelatin? no    Do you have a sensitivity to the preservative Thimersol? no    Do you have a past history of Guillan-Barre Syndrome? no    Do you currently have an acute febrile illness? no  Have you ever had a severe reaction to latex? no    Vaccine information given and explained to patient? yes    Are you currently pregnant? no

## 2010-07-27 NOTE — Assessment & Plan Note (Signed)
Summary: RASH UNDER BREAST, AND GOING ON HER BACK, AND BUTTOCKS/SLJ   Vital Signs:  Patient Profile:   50 Years Old Female Height:     67.5 inches Weight:      282 pounds BMI:     43.67 O2 Sat:      97 % Temp:     98.8 degrees F Pulse rate:   81 / minute Resp:     14 per minute BP sitting:   138 / 86  Vitals Entered By: Sherilyn Banker (January 28, 2008 2:10 PM)                 PCP:  Franchot Heidelberg, MD  Chief Complaint:  rash under breast and on bback.  History of Present Illness: Ptin for acute visit.  She has a rash and states started Friday. She states under left breast and rigfht breast and states goea around side. States itchy. She denies fever, chills and sweats. Only new med was HCTZ as strted on last visit - states had rash pior to this. Started Megace prior to this and states rash after latter. Had been on this about 3weeks prior to rash. She denies recent travel. No use of new lotions, detergents. Denies mucous mebrane peeling and has not self treated.  She now presented.     Prior Medications Reviewed Using: Patient Recall  Updated Prior Medication List: FERROUS SULFATE 325 (65 FE) MG TABS (FERROUS SULFATE) two times a day CARVEDILOL 6.25 MG  TABS (CARVEDILOL) two times a day MEGESTROL ACETATE 40 MG  TABS (MEGESTROL ACETATE) One daily per Dr. Despina Hidden HYDROCHLOROTHIAZIDE 25 MG  TABS (HYDROCHLOROTHIAZIDE) One daily  Current Allergies (reviewed today): No known allergies   Past Medical History:    Reviewed history from 12/21/2007 and no changes required:       Current Problems:        SHOULDER PAIN, LEFT (ICD-719.41)       ESSENTIAL HYPERTENSION, BENIGN (ICD-401.1)       EXCESSIVE MENSTRUATION (ICD-626.2)       ANEMIA, IRON DEFICIENCY (ICD-280.9)       HYPERLIPIDEMIA (ICD-272.4)       MALAISE AND FATIGUE (ICD-780.79)       MORBID OBESITY (ICD-278.01)       GERD (ICD-530.81)         Past Surgical History:    Reviewed history from 09/19/2006 and no  changes required:       Appendectomy   Family History:    Reviewed history from 09/19/2006 and no changes required:       father - hx unknown       mother - breast/ ovarian cancer       4 sisters - 1 with DM       3 children - 2 with Muscular Dystrophy  Social History:    Reviewed history from 09/19/2006 and no changes required:       Married       Never Smoked       Alcohol use-no       Drug use-no   Risk Factors: Tobacco use:  never Drug use:  no Alcohol use:  no  Family History Risk Factors:    Family History of MI in females < 43 years old:  no    Family History of MI in males < 75 years old:  no  Mammogram History:    Date of Last Mammogram:  11/27/2007  PAP Smear History:    Date of Last  PAP Smear:  10/18/2006   Review of Systems      See HPI   Physical Exam  General:     Well-developed,well-nourished,in no acute distress; alert,appropriate and cooperative throughout examination. Obese. Lungs:     Normal respiratory effort, chest expands symmetrically. Lungs are clear to auscultation, no crackles or wheezes. Heart:     Normal rate and regular rhythm. S1 and S2 normal without gallop, murmur, click, rub or other extra sounds. Abdomen:     Bowel sounds positive,abdomen soft and non-tender without masses, organomegaly or hernias noted. Extremities:     No clubbing, cyanosis, edema, or deformity noted with normal full range of motion of all joints.   Skin:     FIne red rash under breasts and left lower back over rubber waste band area. Satelite lesions noted and suggest yeast.    Impression & Recommendations:  Problem # 1:  TINEA CORPORIS (ICD-110.5) Discussed. Councelled body hygiene. Start Nystatin Powder until healed.Councelled washing body with Selsun Blue to kills spores. Update if worsen or do not improve. Agrees.  Complete Medication List: 1)  Ferrous Sulfate 325 (65 Fe) Mg Tabs (Ferrous sulfate) .... Two times a day 2)  Carvedilol 6.25 Mg Tabs  (Carvedilol) .... Two times a day 3)  Megestrol Acetate 40 Mg Tabs (Megestrol acetate) .... One daily per dr. Despina Hidden 4)  Hydrochlorothiazide 25 Mg Tabs (Hydrochlorothiazide) .... One daily 5)  Nystatin Ext Powd (Nystatin) .... Apply to breast folds and lower back two times a day for 14 days.   Patient Instructions: 1)  Apt as scheduled - sooner if sx worsen.   Prescriptions: NYSTATIN  EXT POWD (NYSTATIN) Apply to breast folds and lower back two times a day for 14 days.  #QS x 0   Entered and Authorized by:   Franchot Heidelberg MD   Signed by:   Franchot Heidelberg MD on 01/28/2008   Method used:   Print then Give to Patient   RxID:   [id number]  ]

## 2010-07-27 NOTE — Assessment & Plan Note (Signed)
Summary: medication making her dizzy/dms   Vital Signs:  Patient Profile:   50 Years Old Female Height:     67.5 inches Weight:      279 pounds BMI:     43.21 O2 Sat:      100 % Temp:     97.9 degrees F Pulse rate:   98 / minute Pulse (ortho):   110 / minute Resp:     14 per minute BP sitting:   154 / 99 BP standing:   144 / 92  Vitals Entered By: Sherilyn Banker (June 26, 2007 3:40 PM)                 Serial Vital Signs/Assessments:  Time      Position  BP       Pulse  Resp  Temp     By           Lying LA  154/99   106                   Kim French           Sitting   143/91   98                    Kim French           Standing  144/92   110                   Sherilyn Banker   PCP:  Franchot Heidelberg, MD  Chief Complaint:  BP med making pt dizzy.  History of Present Illness: Pt in for recheck.  She has HTN. She was started on Maxzide last visit. Then began having dizzyness. She states this started hour or two after dose and she just felt miserable. Sx lasted all day. States has not left her since. She notes took first dose the Monday before Xmas.  Denies headache, tremor and weakness. No falls or seizures. Recent cold has cleared.  She states she feels her head is swimmy. She denies blurry vision and has not had ear pain. Notes no dizzyness with postion changes. She denies chest pain, SOB, orthopnea, PND and ankle edema and palpitations.  Denies stuffy nose and sore throat.  Denies falls. No hx of Vertigo. States wqould rate sx as 5/10.  Denies urinary sx inlcuding burning, stinging and bad smells.  Currently having period.  Now presents.  Hypertension History:      She complains of neurologic problems, but denies headache, chest pain, palpitations, dyspnea with exertion, orthopnea, PND, peripheral edema, visual symptoms, syncope, and side effects from treatment.  She notes the following problems with antihypertensive medication side effects: See HPI.        Positive  major cardiovascular risk factors include hyperlipidemia and hypertension.  Negative major cardiovascular risk factors include female age less than 86 years old, no history of diabetes, negative family history for ischemic heart disease, and non-tobacco-user status.        Further assessment for target organ damage reveals no history of ASHD, stroke/TIA, or peripheral vascular disease.    Lipid Management History:      Positive NCEP/ATP III risk factors include hypertension.  Negative NCEP/ATP III risk factors include female age less than 79 years old, no history of early menopause without estrogen hormone replacement, non-diabetic, no family history for ischemic heart disease, non-tobacco-user status, no ASHD (atherosclerotic heart disease), no prior stroke/TIA, no peripheral vascular disease, and no  history of aortic aneurysm.        The patient states that she knows about the "Therapeutic Lifestyle Change" diet.  Her compliance with the TLC diet is fair.      Current Allergies (reviewed today): No known allergies   Past Medical History:    Reviewed history from 09/19/2006 and no changes required:       GERD  Past Surgical History:    Reviewed history from 09/19/2006 and no changes required:       Appendectomy   Family History:    Reviewed history from 09/19/2006 and no changes required:       father - hx unknown       mother - breast/ ovarian cancer       4 sisters - 1 with DM       3 children - 2 with Muscular Dystrophy  Social History:    Reviewed history from 09/19/2006 and no changes required:       Married       Never Smoked       Alcohol use-no       Drug use-no    Review of Systems      See HPI   Physical Exam  General:     Well-developed,well-nourished,in no acute distress; alert,appropriate and cooperative throughout examination Lungs:     Normal respiratory effort, chest expands symmetrically. Lungs are clear to auscultation, no crackles or  wheezes. Heart:     Normal rate and regular rhythm. S1 and S2 normal without gallop, murmur, click, rub or other extra sounds. Abdomen:     Bowel sounds positive,abdomen soft and non-tender without masses, organomegaly or hernias noted. Extremities:     No clubbing, cyanosis, edema, or deformity noted with normal full range of motion of all joints.   Neurologic:     No cranial nerve deficits noted. Station and gait are normal. Plantar reflexes are down-going bilaterally. DTRs are symmetrical throughout. Sensory, motor and coordinative functions appear intact. Cervical Nodes:     No lymphadenopathy noted Psych:     Cognition and judgment appear intact. Alert and cooperative with normal attention span and concentration. No apparent delusions, illusions, hallucinations    Impression & Recommendations:  Problem # 1:  DIZZINESS (ICD-780.4) Discussed. Check CBC and BMP. Advised HGB appears stable with hx of iron def anemia. Unlikley related to anemia and no UTI identified. Note abnormal findings of protein and blood related to active period. Will check renal function and lytes as she had sx after first dose of diurteic. DC latter. Advised she aslo had URI last visit. Sx have cleared but could potentially be after effect causing vertigo-type sx. Trial of Meclizine. Update in 48 hours if sx persist, sooner if worsen. Agrees. Orders: UA Dipstick w/o Micro (35573) T-Basic Metabolic Panel ([id number]) T-CBC w/Diff ([id number])  Her updated medication list for this problem includes:    Meclizine Hcl 25 Mg Tabs (Meclizine hcl) ..... One every 6 hours as needed   Problem # 2:  HYPERTENSION (ICD-401) BP remains high. DC diuretic. Start low dose Norvasc. Councelled diet, exersize and low salt intake. The following medications were removed from the medication list:    Triamterene-hctz 37.5-25 Mg Caps (Triamterene-hctz) ..... One daily  Her updated medication list for this problem includes:     Amlodipine Besylate 5 Mg Tabs (Amlodipine besylate) ..... One daily   Problem # 3:  ANEMIA, IRON DEFICIENCY (ICD-280.9) Repeat CBC. HGB appears stable on FS in office. Reassured. iron as  is. Her updated medication list for this problem includes:    Ferrous Sulfate 325 (65 Fe) Mg Tabs (Ferrous sulfate) .Marland Kitchen..Marland Kitchen Two times a day  Orders: Hgb (25427) Venipuncture (06237)   Complete Medication List: 1)  Ferrous Sulfate 325 (65 Fe) Mg Tabs (Ferrous sulfate) .... Two times a day 2)  Ec-naprosyn 500 Mg Tbec (Naproxen) .... Two times a day 3)  Amlodipine Besylate 5 Mg Tabs (Amlodipine besylate) .... One daily 4)  Meclizine Hcl 25 Mg Tabs (Meclizine hcl) .... One every 6 hours as needed  Hypertension Assessment/Plan:      The patient's hypertensive risk group is category B: At least one risk factor (excluding diabetes) with no target organ damage.  Her calculated 10 year risk of coronary heart disease is 7 %.  Today's blood pressure is 154/99.  Her blood pressure goal is < 140/90.  Lipid Assessment/Plan:      Based on NCEP/ATP III, the patient's risk factor category is "0-1 risk factors".  From this information, the patient's calculated lipid goals are as follows: Total cholesterol goal is 200; LDL cholesterol goal is 160; HDL cholesterol goal is 40; Triglyceride goal is 150.     Patient Instructions: 1)  Please schedule a follow-up appointment in 1 month.    Prescriptions: MECLIZINE HCL 25 MG  TABS (MECLIZINE HCL) One every 6 hours as needed  #60 x 0   Entered and Authorized by:   Franchot Heidelberg MD   Signed by:   Franchot Heidelberg MD on 06/26/2007   Method used:   Print then Give to Patient   RxID:   [id number] AMLODIPINE BESYLATE 5 MG  TABS (AMLODIPINE BESYLATE) One daily  #90 x 3   Entered and Authorized by:   Franchot Heidelberg MD   Signed by:   Franchot Heidelberg MD on 06/26/2007   Method used:   Print then Give to Patient   RxID:   [id number]  ]   Laboratory  Results   Urine Tests    Routine Urinalysis   Color: red Appearance: Cloudy Glucose: negative   (Normal Range: Negative) Bilirubin: small   (Normal Range: Negative) Ketone: trace (5)   (Normal Range: Negative) Spec. Gravity: 1.025   (Normal Range: 1.003-1.035) Blood: large   (Normal Range: Negative) pH: 6.0   (Normal Range: 5.0-8.0) Protein: 100   (Normal Range: Negative) Urobilinogen: 0.2   (Normal Range: 0-1) Nitrite: negative   (Normal Range: Negative) Leukocyte Esterace: small   (Normal Range: Negative)    CBC HGB:  13.5 g/dL   (Normal Range: 09.3-81.8 in Males, 12.0-15.0 in Females)

## 2010-07-27 NOTE — Miscellaneous (Signed)
Summary: Orders Update  Clinical Lists Changes  Orders: Added new Test order of Mammogram (Mammogram) - Signed

## 2010-07-27 NOTE — Assessment & Plan Note (Signed)
Summary: FOLLOW UP   Vital Signs:  Patient Profile:   50 Years Old Female Height:     67.5 inches Weight:      283 pounds BMI:     43.83 O2 Sat:      99 % Temp:     98.1 degrees F Pulse rate:   87 / minute Resp:     12 per minute BP sitting:   152 / 91  Vitals Entered By: Sherilyn Banker (June 08, 2007 3:37 PM)                 PCP:  Franchot Heidelberg, MD  Chief Complaint:  follow up visit.  History of Present Illness: Pt in for recheck.  She notes she is doing well.  She has a hx of iron def anemia. She has very heavy periods. She has uterine fibroids. On Iron. She has felt stronger and is not as tired. Her energy is good. She notes her periods remain very heavy - bleeds for 5 days. States uses 1/2 to 1 pack per period.Cycle regular an sometimes not. She is not sure if she wants a hx and states she wants a watchful waiting approach. She has not had side-effects on her iron pills.  Her main concern today is left arm pain. Sx for several weeks. Hurts mid upper arm. Hard to lift arm above head. States sharp pain. Has used Aleve and no effect. Not really worsening. Denies falls and injury.    She has also been wheezing some at night. States few days. She has a cough. Dry. No fever, chills or sweats. She has a stuffy nose. Some PND. Apetite is good. She has no ill contacts. No recent travel - did go to [place] with daughter at end of last month. She has self treated with Aleve. No relief.  She now presents.  Hypertension History:      She denies headache, chest pain, palpitations, dyspnea with exertion, orthopnea, PND, peripheral edema, visual symptoms, neurologic problems, syncope, and side effects from treatment.  Further comments include: She is agreeable with BP med. She is watching salt. She is not really exersizing and states fell of track.  She has had dental exam and allw as well. She has not completed eye exam yet.        Positive major cardiovascular risk factors include  hyperlipidemia and hypertension.  Negative major cardiovascular risk factors include female age less than 8 years old, no history of diabetes, negative family history for ischemic heart disease, and non-tobacco-user status.        Further assessment for target organ damage reveals no history of ASHD, stroke/TIA, or peripheral vascular disease.    Lipid Management History:      Positive NCEP/ATP III risk factors include hypertension.  Negative NCEP/ATP III risk factors include female age less than 71 years old, no history of early menopause without estrogen hormone replacement, non-diabetic, no family history for ischemic heart disease, non-tobacco-user status, no ASHD (atherosclerotic heart disease), no prior stroke/TIA, no peripheral vascular disease, and no history of aortic aneurysm.        The patient states that she knows about the "Therapeutic Lifestyle Change" diet.  Her compliance with the TLC diet is fair.  The patient expresses understanding of adjunctive measures for cholesterol lowering.  Adjunctive measures started by the patient include aerobic exercise, fiber, and omega-3 supplements.      Current Allergies (reviewed today): No known allergies   Past Medical History:  Reviewed history from 09/19/2006 and no changes required:       GERD  Past Surgical History:    Reviewed history from 09/19/2006 and no changes required:       Appendectomy   Family History:    Reviewed history from 09/19/2006 and no changes required:       father - hx unknown       mother - breast/ ovarian cancer       4 sisters - 1 with DM       3 children - 2 with Muscular Dystrophy  Social History:    Reviewed history from 09/19/2006 and no changes required:       Married       Never Smoked       Alcohol use-no       Drug use-no    Review of Systems      See HPI   Physical Exam  General:     Well-developed,well-nourished,in no acute distress; alert,appropriate and cooperative throughout  examination Head:     Normocephalic and atraumatic without obvious abnormalities. No apparent alopecia or balding. Eyes:     No corneal or conjunctival inflammation noted. EOMI. Perrla. Mild injection Ears:     External ear exam shows no significant lesions or deformities.  Otoscopic examination reveals clear canals, tympanic membranes are intact bilaterally without bulging, retraction, inflammation or discharge. Hearing is grossly normal bilaterally. Nose:     Mod turbinate swelling and clear liquid nares. Mouth:     Oral mucosa and oropharynx without lesions or exudates.  Teeth in good repair. Neck:     No deformities, masses, or tenderness noted. Lungs:     Normal respiratory effort, chest expands symmetrically. Lungs are clear to auscultation, no crackles or wheezes. Heart:     Normal rate and regular rhythm. S1 and S2 normal without gallop, murmur, click, rub or other extra sounds. Abdomen:     Bowel sounds positive,abdomen soft and non-tender without masses, organomegaly or hernias noted. Extremities:     No clubbing, cyanosis, edema, or deformity noted with normal full range of motion of all joints.  Pain insertion site left deltoid. Tender to palpation. Neurologic:     No cranial nerve deficits noted. Station and gait are normal. Plantar reflexes are down-going bilaterally. DTRs are symmetrical throughout. Sensory, motor and coordinative functions appear intact. Psych:     Cognition and judgment appear intact. Alert and cooperative with normal attention span and concentration. No apparent delusions, illusions, hallucinations    Impression & Recommendations:  Problem # 1:  HYPERTENSION (ICD-401) Discussed. BP very high. Advised need for weight loss. Councelled diet and exersize as well as low salt intake. Advised eye exam. Start diuretic and recheck 6 weeks. Advised risk and benefit of diuretic including leg cramps. Update if any concern. Her updated medication list for this  problem includes:    Triamterene-hctz 37.5-25 Mg Caps (Triamterene-hctz) ..... One daily   Problem # 2:  ANEMIA, IRON DEFICIENCY (ICD-280.9) Period induced. Rx as is. Monitor and consider GYN consult if she feels she wants fibroids taken care of. HGB 10 today - slightly down since last check. Iron as is and repeat CBC in 6 weeks. If drops, will need GYN eval. Her updated medication list for this problem includes:    Ferrous Sulfate 325 (65 Fe) Mg Tabs (Ferrous sulfate) .Marland Kitchen..Marland Kitchen Two times a day  Orders: Venipuncture (04540) Hgb (98119)   Problem # 3:  SHOULDER PAIN, LEFT (ICD-719.41) Likley tendonitis. Advised  Naproxen, heat. Single steroid dose of Decadron/Depomedrol.Update if worse and obtain radiological eval and possibly Ortho consult if worse. Her updated medication list for this problem includes:    Ec-naprosyn 500 Mg Tbec (Naproxen) .Marland Kitchen..Marland Kitchen Two times a day   Problem # 4:  MORBID OBESITY (ICD-278.01) See above.  Problem # 5:  VIRAL URI (ICD-465.9) Fluids, rest, trial Alahist. Update if worse or does not resolve. Her updated medication list for this problem includes:    Ec-naprosyn 500 Mg Tbec (Naproxen) .Marland Kitchen..Marland Kitchen Two times a day  Orders: Decadron 1mg  (M0102) Depo- Medrol 80mg  (J1040) Admin of Therapeutic Inj  intramuscular or subcutaneous (72536)   Complete Medication List: 1)  Ferrous Sulfate 325 (65 Fe) Mg Tabs (Ferrous sulfate) .... Two times a day 2)  Ec-naprosyn 500 Mg Tbec (Naproxen) .... Two times a day 3)  Triamterene-hctz 37.5-25 Mg Caps (Triamterene-hctz) .... One daily  Hypertension Assessment/Plan:      The patient's hypertensive risk group is category B: At least one risk factor (excluding diabetes) with no target organ damage.  Her calculated 10 year risk of coronary heart disease is 7 %.  Today's blood pressure is 152/91.  Her blood pressure goal is < 140/90.  Lipid Assessment/Plan:      Based on NCEP/ATP III, the patient's risk factor category is "0-1 risk  factors".  From this information, the patient's calculated lipid goals are as follows: Total cholesterol goal is 200; LDL cholesterol goal is 160; HDL cholesterol goal is 40; Triglyceride goal is 150.     Patient Instructions: 1)  Please schedule a follow-up appointment in 6 weeks.    Prescriptions: TRIAMTERENE-HCTZ 37.5-25 MG  CAPS (TRIAMTERENE-HCTZ) One daily  #30 x 3   Entered and Authorized by:   Franchot Heidelberg MD   Signed by:   Franchot Heidelberg MD on 06/08/2007   Method used:   Print then Give to Patient   RxID:   [id number]  ]     Laboratory Results  CBC HGB:  10 g/dL   (Normal Range: 56.4-33.2 in Males, 12.0-15.0 in Females)     Medication Administration  Injection # 1:    Medication: Decadron 1mg     Diagnosis: VIRAL URI (ICD-465.9)    Route: IM    Site: L deltoid    Exp Date: 11/25/2008    Lot #: [id number]    Mfr: [employer]    Patient tolerated injection without complications    Given by: Sherilyn Banker (June 08, 2007 4:09 PM)  Injection # 2:    Medication: Depo- Medrol 80mg     Diagnosis: VIRAL URI (ICD-465.9)    Route: IM    Site: L deltoid    Exp Date: 09/25/2009    Lot #: [id number]    Mfr: [employer]    Patient tolerated injection without complications    Given by: Sherilyn Banker (June 08, 2007 4:09 PM)  Orders Added: 1)  Est. Patient Level IV [84166] 2)  Venipuncture [06301] 3)  Hgb [85018] 4)  Decadron 1mg  [J1094] 5)  Depo- Medrol 80mg  [J1040] 6)  Admin of Therapeutic Inj  intramuscular or subcutaneous [90772]

## 2010-07-27 NOTE — Miscellaneous (Signed)
Summary: Orders Update  Clinical Lists Changes  Problems: Added new problem of EXCESSIVE MENSTRUATION (ICD-626.2) Orders: Added new Test order of Ultrasound (Ultrasound) - Signed   Appended Document: Orders Update scheduled at [facility] for 12/04/06 at 1200pm. order faxed, message left for patient to call

## 2010-07-27 NOTE — Progress Notes (Signed)
Summary: RESCHEDULE TEST  Phone Note Call from Patient   Reason for Call: Talk to Nurse Summary of Call: PATIENT MISSED HER APPT AT [place] FOR A TEST, SHE THINKS IT WAS A FIBROID TUMORS...SHE FORGOT HER APPT UNTIL TODAY....PLEASE RESCHEDULE IF POSSIBLE...   [phone] Initial call taken by: HEATHER  Follow-up for Phone Call        Spoke with patient, she will call to reschedule Follow-up by: Sonny Dandy,  December 06, 2006 3:39 PM

## 2010-07-27 NOTE — Assessment & Plan Note (Signed)
Summary: new patient/arc   Vital Signs:  Patient Profile:   50 Years Old Female Height:     67.5 inches Weight:      286 pounds BMI:     44.29 O2 Sat:      100 % Temp:     98.2 degrees F Pulse rate:   79 / minute Resp:     16 per minute BP sitting:   148 / 86  Vitals Entered By: Sherilyn Banker (September 19, 2006 3:43 PM)               Visit Type:  Establish PCP:  Franchot Heidelberg, MD  Chief Complaint:  L arm pain.  History of Present Illness: Pt in today to establish. Has no hx of prior PCP.  She has a hx of tightness in chest. She states it feels like a tightening sensation. Rates as 2/10. Sx lasts a few seconds. Worse with eating. Better with not eating. Localized. Apetite is good. No nausea or vomitting noted - sometimes getting little queezy to stomache when she eats. She gets a lot of indigestion - worse with spicy foods.   She notes she does not get winded but once in a while when she does housework. She denies orthopnea, PND, palpitations. She does get leg swelling  - happens monthly - randomly.  She denies wheezing. She does get cough. Dry hack. She notes watery eyes, PND and states she sneeze a lot. She thinks it may be allergies but cannot be sure.  She states she has tried Tylenol for sx and this helps.  Risk factors for atherosclerosis inlcude:  1. Obese 2. Perimenoupousal 3. Non smoker 4. No hx DM 5. Not sure about lipids 6. No early fam hx of CAD 7. HRT - negative  Current Allergies (reviewed today): No known allergies   Past Surgical History:    Appendectomy   Family History:    Father: 42 Not sure about hx    Mother: Dead 20 Ovarian and Breast cancer    Siblings: Sisters x4: 41, 43,46 and 50. Hx of DM in oldest sibling  Social History:    Occupation: Presenter, broadcasting for [employer]    Married    Never Smoked    Alcohol use-no    Drug use-no   Risk Factors:  Tobacco use:  never Drug use:  no Alcohol use:  no  Family History Risk  Factors:    Family History of MI in females < 70 years old:  no    Family History of MI in males < 39 years old:  no   Review of Systems  General      Complains of fatigue and malaise.      Denies chills, fever, and sweats.  Eyes      See HPI  ENT      See HPI  CV      See HPI  Resp      See HPI  GI      See HPI  GU      Denies nocturia, urinary frequency, and urinary hesitancy.      Needs female exam - has not had in years  MS      Denies joint pain, joint redness, joint swelling, cramps, stiffness, and thoracic pain.  Derm      Denies changes in color of skin, excessive perspiration, flushing, and rash.  Neuro      Denies brief paralysis, difficulty with concentration, headaches, inability to speak,  seizures, tremors, and weakness.  Psych      Denies anxiety and depression.  Endo      Denies cold intolerance, excessive hunger, excessive thirst, excessive urination, heat intolerance, and polyuria.  Heme      Denies abnormal bruising, bleeding, enlarge lymph nodes, pallor, and skin discoloration.  Allergy      See HPI   Physical Exam  General:     Well-developed,well-nourished,in no acute distress; alert,appropriate and cooperative throughout examination Head:     Normocephalic and atraumatic without obvious abnormalities. No apparent alopecia or balding. Eyes:     No corneal or conjunctival inflammation noted. EOMI. Perrla. Funduscopic exam benign, without hemorrhages, exudates or papilledema. Vision grossly normal. Ears:     External ear exam shows no significant lesions or deformities.  Otoscopic examination reveals clear canals, tympanic membranes are intact bilaterally without bulging, retraction, inflammation or discharge. Hearing is grossly normal bilaterally. Nose:     External nasal examination shows no deformity or inflammation. Nasal mucosa are pink and moist without lesions or exudates. Mouth:     Oral mucosa and oropharynx without lesions  or exudates.  Teeth in good repair. Neck:     No deformities, masses, or tenderness noted. Lungs:     Normal respiratory effort, chest expands symmetrically. Lungs are clear to auscultation, no crackles or wheezes. Heart:     Normal rate and regular rhythm. S1 and S2 normal without gallop, murmur, click, rub or other extra sounds. Abdomen:     Bowel sounds positive,abdomen soft and non-tender without masses, organomegaly or hernias noted. Extremities:     No clubbing, cyanosis, edema, or deformity noted with normal full range of motion of all joints.   Neurologic:     No cranial nerve deficits noted. Station and gait are normal. Plantar reflexes are down-going bilaterally. DTRs are symmetrical throughout. Sensory, motor and coordinative functions appear intact. Cervical Nodes:     No lymphadenopathy noted Psych:     Cognition and judgment appear intact. Alert and cooperative with normal attention span and concentration. No apparent delusions, illusions, hallucinations    Impression & Recommendations:  Problem # 1:  CHEST PAIN, ATYPICAL (ICD-786.59) Discussed with patient. EKG done appears unremarkable. Suspect related to GERD. Check labs as below to optomize risk factors for atherosclerosis. Start Aciphex 20 mg daily and recheck 4 weeks. If sx persist, will need stress test.  Orders: T-Comprehensive Metabolic Panel ([id number]) T-Lipid Profile ([id number]) EKG w/ Interpretation (93000)   Problem # 2:  GERD (ICD-530.81) See above. TLC encouraged including weight loss, avoidance of precipitating foods. Trial Aciphex. Recheck 4 weeks.  Problem # 3:  ELEVATED BLOOD PRESSURE WITHOUT DIAGNOSIS OF HYPERTENSION (ICD-796.2) Possible anxious about new patient visit and not having had an MD for years. Councelled on diet, exersize and low salt intake as well as need for yearly eye exam to look for BP chnage and also gloucoma. She is at increased risk for latterbeing African American. Recheck  4 weeks and optomize. Discussed criteria for HTN.  Problem # 4:  MORBID OBESITY (ICD-278.01) See above. Await labs. Pt in range for bariatric procedure. Will need dietary eval and further discussion near future.   Problem # 5:  MALAISE AND FATIGUE (ICD-780.79) Check CBC, TSH. Consider sleep eval in light of obesity and risk for sleep apnea. Diff includes depression etc. as well. Orders: T-CBC w/Diff ([id number]) T-TSH [id number])   Problem # 6:  Preventive Health Care (ICD-V70.0) Needs physical next visit - strong fam hx of breast  and ovarian cancer. Will optomize.   Patient Instructions: 1)  Please schedule a follow-up appointment in 1 month - sooner if needed.

## 2010-07-27 NOTE — Assessment & Plan Note (Signed)
Summary: 4 week follow up/arc   Vital Signs:  Patient Profile:   50 Years Old Female Height:     67.5 inches Weight:      283 pounds BMI:     43.83 O2 Sat:      97 % Temp:     98.1 degrees F Pulse rate:   72 / minute Resp:     12 per minute BP sitting:   140 / 89  Vitals Entered By: Sherilyn Banker (January 25, 2008 3:27 PM)                 PCP:  Franchot Heidelberg, MD  Chief Complaint:  follow up viist.  History of Present Illness: Ptin for recheck.  She saw referred to GYN due to excessive menstruation and iron def anemia.She saw Dr. Despina Hidden and was put on Megace. States was told to follow-up Feb 04, 2008 and if sx perisst, may need ablation if this fails. She states still having heavy periods and notes has not had period on Rx yet. She denies malase and fatigue and adds has not filled her iron yet - states just did not do as to busy. She is tolerating megace well and states makes her a little sleepy.  Se was taken off Amlodipine due to leg swelling. She states she is tolerating her Coreg well. She denies chest pain, SOB, orthopnea, PBD, ankle edema and palpitations.States BP at [facility] 143/80s last week.Denies malaise and fatigue.  She is working on diet. She is limiting portions and walks. States begna walking 20 minutes daily and just started back this week.   Foot pain resolved after Naproxen use.  She now presents.  Hypertension History:      She denies headache, chest pain, palpitations, dyspnea with exertion, orthopnea, PND, peripheral edema, visual symptoms, neurologic problems, syncope, and side effects from treatment.  She notes no problems with any antihypertensive medication side effects.  Further comments include: Agrees with HCTZ add -on.        Positive major cardiovascular risk factors include hyperlipidemia and hypertension.  Negative major cardiovascular risk factors include female age less than 9 years old, no history of diabetes, negative family history for  ischemic heart disease, and non-tobacco-user status.        Further assessment for target organ damage reveals no history of ASHD, stroke/TIA, or peripheral vascular disease.    Lipid Management History:      Positive NCEP/ATP III risk factors include hypertension.  Negative NCEP/ATP III risk factors include female age less than 17 years old, no history of early menopause without estrogen hormone replacement, non-diabetic, no family history for ischemic heart disease, non-tobacco-user status, no ASHD (atherosclerotic heart disease), no prior stroke/TIA, no peripheral vascular disease, and no history of aortic aneurysm.        The patient states that she knows about the "Therapeutic Lifestyle Change" diet.  Her compliance with the TLC diet is fair.  The patient expresses understanding of adjunctive measures for cholesterol lowering.  Adjunctive measures started by the patient include aerobic exercise, fiber, ASA, and omega-3 supplements.        Current Allergies: No known allergies       Physical Exam  General:     Well-developed,well-nourished,in no acute distress; alert,appropriate and cooperative throughout examination. Obese. Lungs:     Normal respiratory effort, chest expands symmetrically. Lungs are clear to auscultation, no crackles or wheezes. Heart:     Normal rate and regular rhythm. S1 and S2 normal  without gallop, murmur, click, rub or other extra sounds. Abdomen:     Bowel sounds positive,abdomen soft and non-tender without masses, organomegaly or hernias noted. Extremities:     No clubbing, cyanosis, edema, or deformity noted with normal full range of motion of all joints.   Psych:     Cognition and judgment appear intact. Alert and cooperative with normal attention span and concentration. No apparent delusions, illusions, hallucinations    Impression & Recommendations:  Problem # 1:  EXCESSIVE MENSTRUATION (ICD-626.2) Megace as per GYN. See as scheduled. Await  inout and optomize.  Problem # 2:  ANEMIA, IRON DEFICIENCY (ICD-280.9) Resume iron.  Her updated medication list for this problem includes:    Ferrous Sulfate 325 (65 Fe) Mg Tabs (Ferrous sulfate) .Marland Kitchen..Marland Kitchen Two times a day   Problem # 3:  PLANTAR FASCIITIS, LEFT (ICD-728.71) Resolved. Follow. Councelled foot care and proper shoes. The following medications were removed from the medication list:    Ec-naprosyn 500 Mg Tbec (Naproxen) .Marland Kitchen..Marland Kitchen Two times a day   Problem # 4:  MORBID OBESITY (ICD-278.01) Again coucnelled TLC, portion control and exersize. She is working on this and just begn walking. Recheck 6 weeks and cont to encourage. Aware of risk and benefit.  Complete Medication List: 1)  Ferrous Sulfate 325 (65 Fe) Mg Tabs (Ferrous sulfate) .... Two times a day 2)  Carvedilol 6.25 Mg Tabs (Carvedilol) .... Two times a day 3)  Megestrol Acetate 40 Mg Tabs (Megestrol acetate) .... One daily per dr. Despina Hidden 4)  Hydrochlorothiazide 25 Mg Tabs (Hydrochlorothiazide) .... One daily  Hypertension Assessment/Plan:      The patient's hypertensive risk group is category B: At least one risk factor (excluding diabetes) with no target organ damage.  Her calculated 10 year risk of coronary heart disease is 8 %.  Today's blood pressure is 140/89.  Her blood pressure goal is < 140/90.  Lipid Assessment/Plan:      Based on NCEP/ATP III, the patient's risk factor category is "0-1 risk factors".  From this information, the patient's calculated lipid goals are as follows: Total cholesterol goal is 200; LDL cholesterol goal is 160; HDL cholesterol goal is 40; Triglyceride goal is 150.     Patient Instructions: 1)  Please schedule a follow-up appointment in 6 weeks.   Prescriptions: HYDROCHLOROTHIAZIDE 25 MG  TABS (HYDROCHLOROTHIAZIDE) One daily  #30 x 3   Entered and Authorized by:   Franchot Heidelberg MD   Signed by:   Franchot Heidelberg MD on 01/25/2008   Method used:   Faxed to ...       [facility]       [address]       [place], [place]  [address]       Ph: [phone]       Fax: [phone]   RxID:   [id number]  ]

## 2010-07-27 NOTE — Assessment & Plan Note (Signed)
Summary: 6 WK F/U   Vital Signs:  Patient Profile:   50 Years Old Female Height:     67.5 inches Weight:      281 pounds BMI:     43.52 O2 Sat:      100 % Temp:     98.1 degrees F Pulse rate:   86 / minute Resp:     14 per minute BP sitting:   136 / 87  Vitals Entered By: Sherilyn Banker (April 20, 2007 4:09 PM)                 PCP:  Franchot Heidelberg, MD  Chief Complaint:  follow up visit.  History of Present Illness: Pt in for recheck.  She has had excessive menstruation and is on iron pills. She takes this daily and notes periods are occurring twice a month. Period lasts 4 days. She uses two pads at one time and goes through full pack of pads with this. She denies excessive pain and has some discomfort on day one.  She had labs after last visit:  1. CBC - HGB increased to 11.3 - up from previous 10.7. She is less tired but still struggles with some fatgue. 2. Pelvic Ultrasound - uterine fibroids - see EMR for full details  She is leaning towards a hx and states she will have to give it some though.  She wakes up in the am and has headaches. Has had for 4 weeks. She denies snoring. She has been nauseated - good while. States headache over her eyes. She describes pain as a beating sensation. She has not had blurry vision exept when she reads without her glasses. She denies arm and leg weakness and she denies seizure hx. She denies stuffy nose and sneezing. Occasional scratchy throat. States sx occasionally.   She has some right knee pain. Started yesterday. States pain is like needles and pins - more with walking. Denies injury and trauma and notes she started having pain after wlaking to daughter's school. SHe denies weakness and states she has not taken anything for it.  She has not had flu-shot. Would like one.  Now presents.   Hypertension History:      She complains of headache, but denies chest pain, palpitations, dyspnea with exertion, orthopnea, PND, peripheral  edema, visual symptoms, neurologic problems, syncope, and side effects from treatment.  She notes no problems with any antihypertensive medication side effects.  Further comments include: Tolerating meds. No side-effects.        Positive major cardiovascular risk factors include hyperlipidemia and hypertension.  Negative major cardiovascular risk factors include female age less than 88 years old, no history of diabetes, negative family history for ischemic heart disease, and non-tobacco-user status.        Further assessment for target organ damage reveals no history of ASHD, stroke/TIA, or peripheral vascular disease.    Lipid Management History:      Positive NCEP/ATP III risk factors include hypertension.  Negative NCEP/ATP III risk factors include female age less than 102 years old, no history of early menopause without estrogen hormone replacement, non-diabetic, no family history for ischemic heart disease, non-tobacco-user status, no ASHD (atherosclerotic heart disease), no prior stroke/TIA, no peripheral vascular disease, and no history of aortic aneurysm.      Current Allergies (reviewed today): No known allergies   Past Medical History:    Reviewed history from 09/19/2006 and no changes required:       GERD  Past Surgical  History:    Reviewed history from 09/19/2006 and no changes required:       Appendectomy   Family History:    Reviewed history from 09/19/2006 and no changes required:       father - hx unknown       mother - breast/ ovarian cancer       4 sisters - 1 with DM       3 children - 2 with Muscular Dystrophy  Social History:    Reviewed history from 09/19/2006 and no changes required:       Married       Never Smoked       Alcohol use-no       Drug use-no    Review of Systems      See HPI   Physical Exam  General:     Well-developed,well-nourished,in no acute distress; alert,appropriate and cooperative throughout examination. Obese. Lungs:      Normal respiratory effort, chest expands symmetrically. Lungs are clear to auscultation, no crackles or wheezes. Heart:     Normal rate and regular rhythm. S1 and S2 normal without gallop, murmur, click, rub or other extra sounds. Abdomen:     Bowel sounds positive,abdomen soft and non-tender without masses, organomegaly or hernias noted. Extremities:     No clubbing, cyanosis, edema, or deformity noted with normal full range of motion of all joints.  Tender insertion site of left deltoid.  Neurologic:     No cranial nerve deficits noted. Station and gait are normal. Plantar reflexes are down-going bilaterally. DTRs are symmetrical throughout. Sensory, motor and coordinative functions appear intact. Psych:     Cognition and judgment appear intact. Alert and cooperative with normal attention span and concentration. No apparent delusions, illusions, hallucinations    Impression & Recommendations:  Problem # 1:  ANEMIA, IRON DEFICIENCY (ICD-280.9) Cont iron supplement. She has uterine fibroids as likely cause for heavy periods. Advised need for monitoring as menopouse wioll eventually lead to amenorhea vs having hysterectomy. She is leaning towards this but would like to think about it a bit. Advised GYN referal if hysterectomy is route she would like to go. Await input. Her updated medication list for this problem includes:    Ferrous Sulfate 325 (65 Fe) Mg Tabs (Ferrous sulfate) .Marland Kitchen..Marland Kitchen Two times a day   Problem # 2:  MALAISE AND FATIGUE (ICD-780.79) Improving. Likely related to iron def anemia. Meds as is. Councelled heatly diet, weight reduction and exersize.  Problem # 3:  MORBID OBESITY (ICD-278.01) She has morning headaches. Diff broadf and advised suspect possible sleep apnea vsn intracranial cause. Offered CT. Will consider this and check with spuse on sleeping habits. Recheck 6 weeks - sooner if worse. No Rx as cause unclear at this time.  Problem # 4:  KNEE PAIN, RIGHT  (ICD-719.46) Mild. Trial EC Naproxen two times a day for five days. If sx worsen, update. May also use topical bengay or aspercreme. Her updated medication list for this problem includes:    Ec-naprosyn 500 Mg Tbec (Naproxen) .Marland Kitchen..Marland Kitchen Two times a day   Problem # 5:  Preventive Health Care (ICD-V70.0) Flu-shot today. Advised risk and benefit and agreeable. F/up if any concern.  Complete Medication List: 1)  Ferrous Sulfate 325 (65 Fe) Mg Tabs (Ferrous sulfate) .... Two times a day 2)  Ec-naprosyn 500 Mg Tbec (Naproxen) .... Two times a day  Other Orders: Influenza Vaccine NON MCR ([id number])  Hypertension Assessment/Plan:      The patient's  hypertensive risk group is category B: At least one risk factor (excluding diabetes) with no target organ damage.  Her calculated 10 year risk of coronary heart disease is 5 %.  Today's blood pressure is 136/87.  Her blood pressure goal is < 140/90.  Lipid Assessment/Plan:      Based on NCEP/ATP III, the patient's risk factor category is "0-1 risk factors".  From this information, the patient's calculated lipid goals are as follows: Total cholesterol goal is 200; LDL cholesterol goal is 160; HDL cholesterol goal is 40; Triglyceride goal is 150.     Patient Instructions: 1)  Please schedule a follow-up appointment in 6 weeks.    ]  Influenza Vaccine    Vaccine Type: Fluvax Non-MCR    Site: left deltoid    Mfr: novartis    Dose: 0.5 ml    Route: IM    Given by: Sherilyn Banker    Exp. Date: 10/26/2007    VIS given: 12/24/04 version given April 20, 2007.  Flu Vaccine Consent Questions    Do you have a history of severe allergic reactions to this vaccine? no    Any prior history of allergic reactions to egg and/or gelatin? no    Do you have a sensitivity to the preservative Thimersol? no    Do you have a past history of Guillan-Barre Syndrome? no    Do you currently have an acute febrile illness? no    Have you ever had a severe reaction to latex?  no    Vaccine information given and explained to patient? yes    Are you currently pregnant? no

## 2010-07-27 NOTE — Progress Notes (Signed)
Summary: lower eyelid red and swelling  Phone Note Call from Patient   Caller: Patient Call For: NURSE Summary of Call: patient called to say that she has redness,swelling to lower eyelid. nurse informed her that she needed an OV. Initial call taken by: Harlene Salts,  December 12, 2008 2:33 PM

## 2011-01-24 ENCOUNTER — Other Ambulatory Visit (HOSPITAL_COMMUNITY): Payer: Self-pay | Admitting: Family Medicine

## 2011-01-24 DIAGNOSIS — Z139 Encounter for screening, unspecified: Secondary | ICD-10-CM

## 2011-01-28 ENCOUNTER — Ambulatory Visit (HOSPITAL_COMMUNITY): Payer: Self-pay

## 2011-03-01 ENCOUNTER — Other Ambulatory Visit (HOSPITAL_COMMUNITY): Payer: Self-pay | Admitting: Family Medicine

## 2011-03-01 DIAGNOSIS — Z1231 Encounter for screening mammogram for malignant neoplasm of breast: Secondary | ICD-10-CM

## 2011-03-07 ENCOUNTER — Ambulatory Visit (HOSPITAL_COMMUNITY)
Admission: RE | Admit: 2011-03-07 | Discharge: 2011-03-07 | Disposition: A | Discharge status: Home / Self Care | Payer: BC Managed Care – PPO | Source: Ambulatory Visit | Attending: Family Medicine | Admitting: Family Medicine

## 2011-03-07 DIAGNOSIS — Z1231 Encounter for screening mammogram for malignant neoplasm of breast: Secondary | ICD-10-CM

## 2012-03-23 ENCOUNTER — Ambulatory Visit (INDEPENDENT_AMBULATORY_CARE_PROVIDER_SITE_OTHER): Payer: BC Managed Care – PPO | Admitting: Family Medicine

## 2012-03-23 ENCOUNTER — Encounter: Payer: Self-pay | Admitting: Family Medicine

## 2012-03-23 VITALS — BP 140/80 | HR 90 | Resp 15 | Ht 68.0 in | Wt 287.0 lb

## 2012-03-23 DIAGNOSIS — R609 Edema, unspecified: Secondary | ICD-10-CM

## 2012-03-23 DIAGNOSIS — Z23 Encounter for immunization: Secondary | ICD-10-CM

## 2012-03-23 DIAGNOSIS — Z1239 Encounter for other screening for malignant neoplasm of breast: Secondary | ICD-10-CM

## 2012-03-23 DIAGNOSIS — I10 Essential (primary) hypertension: Secondary | ICD-10-CM

## 2012-03-23 DIAGNOSIS — D509 Iron deficiency anemia, unspecified: Secondary | ICD-10-CM

## 2012-03-23 DIAGNOSIS — J45909 Unspecified asthma, uncomplicated: Secondary | ICD-10-CM

## 2012-03-23 DIAGNOSIS — E785 Hyperlipidemia, unspecified: Secondary | ICD-10-CM

## 2012-03-23 MED ORDER — HYDROCHLOROTHIAZIDE 12.5 MG PO CAPS: 12.5000 mg | ORAL_CAPSULE | Freq: Every day | ORAL | Status: DC

## 2012-03-23 NOTE — Assessment & Plan Note (Signed)
Check FLP prior to physical

## 2012-03-23 NOTE — Assessment & Plan Note (Signed)
No exercise, we will get baseline labs

## 2012-03-23 NOTE — Assessment & Plan Note (Signed)
Diagnosed in adulthood, no current meds, no recent flares

## 2012-03-23 NOTE — Assessment & Plan Note (Signed)
Check CBC.No current treatment

## 2012-03-23 NOTE — Patient Instructions (Addendum)
Start the new water pill once a day Flu shot given  Get the labs done the day of physical- do not eat after midnight  Records to  Be reviewed F/U 8 weeks

## 2012-03-23 NOTE — Assessment & Plan Note (Signed)
Mild edema, low dose HCTZ, per records chronic problem

## 2012-03-23 NOTE — Progress Notes (Signed)
  Subjective:    Patient ID: Courtney Hunt, female    DOB: [birth year], 51 y.o.   MRN: [id number]  HPI  Patient presents to establish care. Previous PCP was Dr. Barbara Cower at [facility]. She's not been seen in one year. She complained of back pain a few weeks ago however this is now resolved. 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 She has a lot of lifting and transferring of him from bed to wheelchair. She is currently not on any medications. Her records from when she was previously in our office states that she hypertension, hyperlipidemia, leg edema, asthma and anemia. She has noticed that she has swelling on and off in her ankles which is worse after she stands Review of Systems  GEN- denies fatigue, fever, weight loss,weakness, recent illness HEENT- denies eye drainage, change in vision, nasal discharge, CVS- denies chest pain, palpitations RESP- denies SOB, cough, wheeze ABD- denies N/V, change in stools, abd pain GU- denies dysuria, hematuria, dribbling, incontinence MSK- denies joint pain, muscle aches, injury Neuro- denies headache, dizziness, syncope, seizure activity      Objective:   Physical Exam GEN- NAD, alert and oriented x3, obese HEENT- PERRL, EOMI, non injected sclera, pink conjunctiva, MMM, oropharynx clear Neck- Supple, no thryomegaly CVS- RRR, no murmur RESP-CTAB ABD-NABS,soft,NT,ND EXT-pedal edema Pulses- Radial, DP- 2+        Assessment & Plan:

## 2012-03-23 NOTE — Assessment & Plan Note (Signed)
Start low dose HCTZ for leg swelling, BP mildly elevated

## 2012-05-18 ENCOUNTER — Ambulatory Visit: Payer: BC Managed Care – PPO | Admitting: Family Medicine

## 2012-05-18 ENCOUNTER — Encounter: Payer: Self-pay | Admitting: Family Medicine

## 2012-06-26 ENCOUNTER — Ambulatory Visit: Payer: BC Managed Care – PPO | Admitting: Family Medicine

## 2012-06-26 ENCOUNTER — Encounter: Payer: Self-pay | Admitting: Family Medicine

## 2012-06-26 ENCOUNTER — Ambulatory Visit (INDEPENDENT_AMBULATORY_CARE_PROVIDER_SITE_OTHER): Payer: BC Managed Care – PPO | Admitting: Family Medicine

## 2012-06-26 ENCOUNTER — Other Ambulatory Visit: Payer: Self-pay | Admitting: Family Medicine

## 2012-06-26 ENCOUNTER — Other Ambulatory Visit (HOSPITAL_COMMUNITY)
Admission: RE | Admit: 2012-06-26 | Discharge: 2012-06-26 | Disposition: A | Discharge status: Home / Self Care | Payer: BC Managed Care – PPO | Source: Ambulatory Visit | Attending: Family Medicine | Admitting: Family Medicine

## 2012-06-26 VITALS — BP 134/78 | HR 77 | Resp 18 | Ht 68.0 in | Wt 291.1 lb

## 2012-06-26 DIAGNOSIS — Z1211 Encounter for screening for malignant neoplasm of colon: Secondary | ICD-10-CM

## 2012-06-26 DIAGNOSIS — B372 Candidiasis of skin and nail: Secondary | ICD-10-CM

## 2012-06-26 DIAGNOSIS — I10 Essential (primary) hypertension: Secondary | ICD-10-CM

## 2012-06-26 DIAGNOSIS — R609 Edema, unspecified: Secondary | ICD-10-CM

## 2012-06-26 DIAGNOSIS — Z0000 Encounter for general adult medical examination without abnormal findings: Secondary | ICD-10-CM

## 2012-06-26 DIAGNOSIS — Z01419 Encounter for gynecological examination (general) (routine) without abnormal findings: Secondary | ICD-10-CM | POA: Insufficient documentation

## 2012-06-26 LAB — CBC
HCT: 34.3 % — ABNORMAL LOW (ref 36.0–46.0)
Hemoglobin: 11.1 g/dL — ABNORMAL LOW (ref 12.0–15.0)
MCH: 24.9 pg — ABNORMAL LOW (ref 26.0–34.0)
MCHC: 32.4 g/dL (ref 30.0–36.0)
MCV: 77.1 fL — ABNORMAL LOW (ref 78.0–100.0)
Platelets: 321 10*3/uL (ref 150–400)
RBC: 4.45 MIL/uL (ref 3.87–5.11)
RDW: 17 % — ABNORMAL HIGH (ref 11.5–15.5)
WBC: 8.6 10*3/uL (ref 4.0–10.5)

## 2012-06-26 LAB — POC HEMOCCULT BLD/STL (OFFICE/1-CARD/DIAGNOSTIC): Fecal Occult Blood, POC: NEGATIVE

## 2012-06-26 LAB — TSH: TSH: 2.498 u[IU]/mL (ref 0.350–4.500)

## 2012-06-26 MED ORDER — HYDROCHLOROTHIAZIDE 12.5 MG PO CAPS: 12.5000 mg | ORAL_CAPSULE | Freq: Every day | ORAL | Status: DC

## 2012-06-26 MED ORDER — NYSTATIN 100000 UNIT/GM EX POWD: CUTANEOUS | Status: AC

## 2012-06-26 NOTE — Assessment & Plan Note (Signed)
Much improved

## 2012-06-26 NOTE — Assessment & Plan Note (Signed)
Beneath breast, nystatin powder

## 2012-06-26 NOTE — Assessment & Plan Note (Signed)
Gained a few pounds since last visit

## 2012-06-26 NOTE — Progress Notes (Signed)
  Subjective:    Patient ID: Courtney Hunt, female    DOB: 05-30-1961, 51 y.o.   MRN: [id number]  HPI Patient here for complete physical exam. She has no specific concerns. She is fasting today for her labs. She's taken hydrochlorothiazide for blood pressure and mild leg swelling without any problems. Over due for mammogram. Last Pap smear 2011. Now due for colonoscopy screening and tetanus vaccine   Review of Systems   GEN- denies fatigue, fever, weight loss,weakness, recent illness HEENT- denies eye drainage, change in vision, nasal discharge, CVS- denies chest pain, palpitations RESP- denies SOB, cough, wheeze ABD- denies N/V, change in stools, abd pain GU- denies dysuria, hematuria, dribbling, incontinence MSK- denies joint pain, muscle aches, injury Neuro- denies headache, dizziness, syncope, seizure activity      Objective:   Physical Exam GEN- NAD, alert and oriented x3 HEENT- PERRL, EOMI, non injected sclera, pink conjunctiva, MMM, oropharynx clear Neck- Supple, no thyromegaly CVS- RRR, no murmur RESP-CTAB ABD-NABS,soft,NT,ND Breast- normal symmetry, no nipple inversion,no nipple drainage, no nodules or lumps felt, moisture, mild erythema beneath breast Nodes- no axillary nodes GU- normal external genitalia, vaginal mucosa pink and moist, cervix visualized no growth, no blood form os, no discharge, no CMT, no ovarian masses, uterus normal size, urethra in normal position Rectum-normal tone, FOBT negative,  EXT- No edema         Assessment & Plan:   CPE- PAP Smear done, Mammogram to be scheduled by patient, fasting labs today Immunizations UTD, discussed colonoscopy she will decide by next visit

## 2012-06-26 NOTE — Assessment & Plan Note (Signed)
Well controlled continue HCTZ 

## 2012-06-26 NOTE — Patient Instructions (Addendum)
I recommend eye visit once a year- you have poor vision on testing in office I recommend dental visit every 6 months Goal is to  Exercise 30 minutes 5 days a week We will send a letter with lab results  You can also log on to your account- password and instructions given to view on line Continue blood pressure medications Schedule your Mammogram Colonoscopy recommended, review handout F/U 6 months

## 2012-06-27 LAB — LIPID PANEL
Cholesterol: 175 mg/dL (ref 0–200)
HDL: 46 mg/dL (ref >39)
LDL Cholesterol: 112 mg/dL — ABNORMAL HIGH (ref 0–99)
Total CHOL/HDL Ratio: 3.8 Ratio
Triglycerides: 86 mg/dL (ref <150)
VLDL: 17 mg/dL (ref 0–40)

## 2012-06-27 LAB — COMPREHENSIVE METABOLIC PANEL
ALT: 31 U/L (ref 0–35)
AST: 38 U/L — ABNORMAL HIGH (ref 0–37)
Albumin: 4 g/dL (ref 3.5–5.2)
Alkaline Phosphatase: 79 U/L (ref 39–117)
BUN: 14 mg/dL (ref 6–23)
CO2: 23 mEq/L (ref 19–32)
Calcium: 8.9 mg/dL (ref 8.4–10.5)
Chloride: 102 mEq/L (ref 96–112)
Creat: 0.79 mg/dL (ref 0.50–1.10)
Glucose, Bld: 123 mg/dL — ABNORMAL HIGH (ref 70–99)
Potassium: 4.5 mEq/L (ref 3.5–5.3)
Sodium: 143 mEq/L (ref 135–145)
Total Bilirubin: 0.3 mg/dL (ref 0.3–1.2)
Total Protein: 7.2 g/dL (ref 6.0–8.3)

## 2012-06-28 LAB — HEMOGLOBIN A1C
Hgb A1c MFr Bld: 6.7 % — ABNORMAL HIGH (ref <5.7)
Mean Plasma Glucose: 146 mg/dL — ABNORMAL HIGH (ref <117)

## 2012-07-06 NOTE — Progress Notes (Signed)
Appointment 1.17.2014 with Dr. Jeanice Lim

## 2012-07-06 NOTE — Progress Notes (Signed)
Appointment 07/13/2012 at 3:15 with Dr. Jeanice Lim patient is aware

## 2012-07-13 ENCOUNTER — Encounter: Payer: Self-pay | Admitting: Family Medicine

## 2012-07-13 ENCOUNTER — Ambulatory Visit (INDEPENDENT_AMBULATORY_CARE_PROVIDER_SITE_OTHER): Payer: BC Managed Care – PPO | Admitting: Family Medicine

## 2012-07-13 VITALS — BP 150/72 | HR 92 | Resp 18 | Ht 68.0 in | Wt 289.1 lb

## 2012-07-13 DIAGNOSIS — E119 Type 2 diabetes mellitus without complications: Secondary | ICD-10-CM

## 2012-07-13 DIAGNOSIS — E785 Hyperlipidemia, unspecified: Secondary | ICD-10-CM

## 2012-07-13 DIAGNOSIS — I10 Essential (primary) hypertension: Secondary | ICD-10-CM

## 2012-07-13 MED ORDER — METFORMIN HCL ER 500 MG PO TB24: 500.0000 mg | ORAL_TABLET | Freq: Every day | ORAL | Status: DC

## 2012-07-13 MED ORDER — LISINOPRIL-HYDROCHLOROTHIAZIDE 10-12.5 MG PO TABS: 1.0000 | ORAL_TABLET | Freq: Every day | ORAL | Status: DC

## 2012-07-13 NOTE — Patient Instructions (Addendum)
Start metformin once a day for diabetes Given handout on diabetes mellitus and diet Start lisinopril HCTZ when he gets her new prescription for blood pressure Take blood sugar fasting to see what foods are causing her sugar to be elevated Increase her activity level Followup 3 months

## 2012-07-16 ENCOUNTER — Encounter: Payer: Self-pay | Admitting: Family Medicine

## 2012-07-16 DIAGNOSIS — E119 Type 2 diabetes mellitus without complications: Secondary | ICD-10-CM | POA: Insufficient documentation

## 2012-07-16 NOTE — Assessment & Plan Note (Signed)
Blood pressure uncontrolled. We'll start her on lisinopril HCTZ to an ACE inhibitor with new diagnosis of diabetes mellitus

## 2012-07-16 NOTE — Assessment & Plan Note (Signed)
Overall cholesterol panel looks good her LDL is elevated at 112. At this time I would not start a statin drug we'll concentrate on her diet and her activity level with her new diagnosis of diabetes mellitus will recheck this in 3 months

## 2012-07-16 NOTE — Progress Notes (Signed)
  Subjective:    Patient ID: Courtney Hunt, female    DOB: 1961/02/22, 52 y.o.   MRN: [id number]  HPI   Patient here to followup lab results. Fasting labs from her complete physical exam shows new diagnosis of diabetes mellitus with A1c of 6.7% after elevated blood glucose town. She's is taking all of her medication including her blood pressure pill today. She has no signs of diabetes mellitus polyuria polydipsia sweating, shakes   Review of Systems  - per above     Objective:   Physical Exam GEN-NAD,alert and oriented x 3       Assessment & Plan:

## 2012-07-16 NOTE — Assessment & Plan Note (Signed)
Newly diagnosed diabetes mellitus. She was shown how to use the glucometer in the office today we discussed foods to avoid with high sugar content given handouts. She will check blood sugars fasting she will start metformin once a day.

## 2012-10-12 ENCOUNTER — Ambulatory Visit: Payer: BC Managed Care – PPO | Admitting: Family Medicine

## 2012-10-19 ENCOUNTER — Ambulatory Visit: Payer: BC Managed Care – PPO | Admitting: Family Medicine

## 2012-12-14 ENCOUNTER — Encounter: Payer: Self-pay | Admitting: Family Medicine

## 2012-12-14 ENCOUNTER — Ambulatory Visit (INDEPENDENT_AMBULATORY_CARE_PROVIDER_SITE_OTHER): Payer: PRIVATE HEALTH INSURANCE | Admitting: Family Medicine

## 2012-12-14 VITALS — BP 120/60 | HR 78 | Temp 97.0°F | Resp 20 | Ht 68.5 in | Wt 270.0 lb

## 2012-12-14 DIAGNOSIS — I10 Essential (primary) hypertension: Secondary | ICD-10-CM

## 2012-12-14 DIAGNOSIS — R232 Flushing: Secondary | ICD-10-CM

## 2012-12-14 DIAGNOSIS — E119 Type 2 diabetes mellitus without complications: Secondary | ICD-10-CM

## 2012-12-14 DIAGNOSIS — N951 Menopausal and female climacteric states: Secondary | ICD-10-CM

## 2012-12-14 DIAGNOSIS — R609 Edema, unspecified: Secondary | ICD-10-CM

## 2012-12-14 LAB — HEMOGLOBIN A1C, FINGERSTICK: Hgb A1C (fingerstick): 6 % — ABNORMAL HIGH (ref <5.7)

## 2012-12-14 MED ORDER — LISINOPRIL 5 MG PO TABS: 5.0000 mg | ORAL_TABLET | Freq: Every day | ORAL | Status: DC

## 2012-12-14 NOTE — Patient Instructions (Addendum)
Keep up the good work with the weight loss Restart the blood pressure medication once a day  Take multivitamin with calcium or get calcium (1200mg ) and Vit D ( 800IU) Estroven vitamin for hot flashes or black cohosh  I recommend you get your mammogram I recommend eye visit F/U 6 months

## 2012-12-14 NOTE — Progress Notes (Signed)
  Subjective:    Patient ID: Courtney Hunt, female    DOB: [birth year], 52 y.o.   MRN: [id number]  HPI Patient here follow chronic medical problems. She's been out of her medications for the past 2 months because she lost her job was unable to afford them. She was checking her blood sugars in the highest it would be will 150 she was not checking daily. She denies any polyuria, polydipsia. She has lost 20 pounds by changing her diet and exercising more. She's not had any leg swelling since she lost weight. She also complains of worsening hot flashes past few months, has not had a regular cycle for a few months now   Review of Systems   GEN- denies fatigue, fever, weight loss,weakness, recent illness HEENT- denies eye drainage, change in vision, nasal discharge, CVS- denies chest pain, palpitations RESP- denies SOB, cough, wheeze ABD- denies N/V, change in stools, abd pain GU- denies dysuria, hematuria, dribbling, incontinence MSK- denies joint pain, muscle aches, injury Neuro- denies headache, dizziness, syncope, seizure activity      Objective:   Physical Exam GEN- NAD, alert and oriented x3 HEENT- PERRL, EOMI, non injected sclera, pink conjunctiva, MMM, oropharynx clear CVS- RRR, no murmur RESP-CTAB EXT- No edema Pulses- Radial, DP- 2+         Assessment & Plan:

## 2012-12-15 LAB — MICROALBUMIN / CREATININE URINE RATIO
Creatinine, Urine: 219.8 mg/dL
Microalb Creat Ratio: 4.7 mg/g (ref 0.0–30.0)
Microalb, Ur: 1.03 mg/dL (ref 0.00–1.89)

## 2012-12-15 LAB — BASIC METABOLIC PANEL
BUN: 15 mg/dL (ref 6–23)
CO2: 27 mEq/L (ref 19–32)
Calcium: 9.5 mg/dL (ref 8.4–10.5)
Chloride: 105 mEq/L (ref 96–112)
Creat: 0.97 mg/dL (ref 0.50–1.10)
Glucose, Bld: 98 mg/dL (ref 70–99)
Potassium: 3.9 mEq/L (ref 3.5–5.3)
Sodium: 138 mEq/L (ref 135–145)

## 2012-12-16 ENCOUNTER — Encounter: Payer: Self-pay | Admitting: Family Medicine

## 2012-12-16 DIAGNOSIS — R232 Flushing: Secondary | ICD-10-CM | POA: Insufficient documentation

## 2012-12-16 NOTE — Assessment & Plan Note (Addendum)
BP looks good off medication, change to 5mg  lisinopril with her DM, I dont think she needs much more than this

## 2012-12-16 NOTE — Assessment & Plan Note (Signed)
Advised MVI or black cohosh

## 2012-12-16 NOTE — Assessment & Plan Note (Signed)
She has lost 20lbs, continues to exercise, walks 5 miles

## 2012-12-16 NOTE — Assessment & Plan Note (Signed)
Improved with weight loss 

## 2012-12-16 NOTE — Assessment & Plan Note (Signed)
D/c  Metformin, Urine Micro obtained A1C looks great, continue to use diet and exercise to control, recheck A1C next visit

## 2012-12-21 ENCOUNTER — Ambulatory Visit: Payer: BC Managed Care – PPO | Admitting: Family Medicine

## 2012-12-21 ENCOUNTER — Ambulatory Visit: Payer: Self-pay | Admitting: Family Medicine

## 2013-04-05 ENCOUNTER — Encounter: Payer: Self-pay | Admitting: Family Medicine

## 2013-04-05 ENCOUNTER — Ambulatory Visit (INDEPENDENT_AMBULATORY_CARE_PROVIDER_SITE_OTHER): Payer: PRIVATE HEALTH INSURANCE | Admitting: Family Medicine

## 2013-04-05 VITALS — BP 136/86 | HR 64 | Temp 98.0°F | Resp 18 | Wt 272.0 lb

## 2013-04-05 DIAGNOSIS — M7551 Bursitis of right shoulder: Secondary | ICD-10-CM

## 2013-04-05 DIAGNOSIS — M67919 Unspecified disorder of synovium and tendon, unspecified shoulder: Secondary | ICD-10-CM

## 2013-04-05 MED ORDER — DICLOFENAC SODIUM 75 MG PO TBEC: 75.0000 mg | DELAYED_RELEASE_TABLET | Freq: Two times a day (BID) | ORAL | Status: DC

## 2013-04-05 NOTE — Patient Instructions (Addendum)
Call if you do not improve within the next 2 weeks, then referral to ortho Flu shot given See exercises   - Bursitis Bursitis is when the fluid-filled sac (bursa) that covers and protects a joint gets puffy and irritated. The elbow, shoulder, hip, and knee joints are most often affected. HOME CARE  Put ice on the area.  Put ice in a plastic bag.  Place a towel between your skin and the bag.  Leave the ice on for 15-20 minutes, 3-4 times a day.  Put the joint through a full range of motion 4 times a day. Rest the injured joint at other times. When you have less pain, begin slow movements and usual activities.  Only take medicine as told by your doctor.  Follow up with your doctor. Any delay in care could stop the bursitis from healing. This could cause long-term pain. GET HELP RIGHT AWAY IF:   You have more pain with treatment.  You have a temperature by mouth above 102 F (38.9 C), not controlled by medicine.  You have heat and irritation over the fluid-filled sac. MAKE SURE YOU:   Understand these instructions.  Will watch your condition.  Will get help right away if you are not doing well or get worse. Document Released: 12/01/2009 Document Revised: 09/05/2011 Document Reviewed: 12/01/2009 [facility] Patient Information 2014 [place], [place].

## 2013-04-07 DIAGNOSIS — M7550 Bursitis of unspecified shoulder: Secondary | ICD-10-CM | POA: Insufficient documentation

## 2013-04-07 NOTE — Assessment & Plan Note (Signed)
Given instructions on ROM/exercises  Start diclofenac BID ICE If no improvement refer to ortho

## 2013-04-07 NOTE — Progress Notes (Signed)
  Subjective:    Patient ID: Courtney Hunt, female    DOB: 02/24/1961, 52 y.o.   MRN: [id number]  HPI  Pt here with right arm pain for the past 2-3 weeks. No specific injury, woke up with pain. 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  Has pain when she lies on right shoulder or when she tries to lift her arm up over her head   Review of Systems  GEN- denies fatigue, fever, weight loss,weakness, recent illness HEENT- denies eye drainage, change in vision, nasal discharge, CVS- denies chest pain, palpitations RESP- denies SOB, cough, wheeze MSK- + joint pain, muscle aches, injury Neuro- denies headache, dizziness, syncope, seizure activity      Objective:   Physical Exam GEN- NAD, alert and oriented x3 NECK- FROM, supple MSK- Normal inspection UE, neg empty can test, neg impingment signs, biceps intact, TTP over AC and deltoid, Decreased ROM Right shoulder compared to left  EXT- No edema Pulses- Radial 2+        Assessment & Plan:

## 2013-04-22 ENCOUNTER — Ambulatory Visit (INDEPENDENT_AMBULATORY_CARE_PROVIDER_SITE_OTHER): Payer: PRIVATE HEALTH INSURANCE | Admitting: Family Medicine

## 2013-04-22 VITALS — BP 120/78 | HR 72 | Temp 97.2°F | Resp 18 | Wt 273.0 lb

## 2013-04-22 DIAGNOSIS — M7551 Bursitis of right shoulder: Secondary | ICD-10-CM

## 2013-04-22 DIAGNOSIS — M25519 Pain in unspecified shoulder: Secondary | ICD-10-CM

## 2013-04-22 DIAGNOSIS — M25511 Pain in right shoulder: Secondary | ICD-10-CM

## 2013-04-22 DIAGNOSIS — M67919 Unspecified disorder of synovium and tendon, unspecified shoulder: Secondary | ICD-10-CM

## 2013-04-22 MED ORDER — HYDROCODONE-ACETAMINOPHEN 5-325 MG PO TABS: 1.0000 | ORAL_TABLET | Freq: Four times a day (QID) | ORAL | Status: DC | PRN

## 2013-04-22 MED ORDER — LISINOPRIL 5 MG PO TABS: 5.0000 mg | ORAL_TABLET | Freq: Every day | ORAL | Status: DC

## 2013-04-22 NOTE — Assessment & Plan Note (Signed)
Per above, will treat for bursitis, doubt a tear in rotator cuff ,based on improved exam today

## 2013-04-22 NOTE — Assessment & Plan Note (Signed)
She continues to have some pain worse at night but during the day is much improved. Her range of motion overall is much improved  We will continue with anti-inflammatories for another 2 weeks that'll be a total of 4 weeks. I will add hydrocodone as needed at bedtime for pain. She will start exercises for range of motion and will also use ice if she overuses the shoulder during the day with her job. At that time we can discuss if orthopedics as needed. We can hold off as she has improved significantly

## 2013-04-22 NOTE — Progress Notes (Signed)
  Subjective:    Patient ID: Courtney Hunt, female    DOB: 02-20-1961, 52 y.o.   MRN: [id number]  HPI Patient here to followup right shoulder pain. She was seen about 2 weeks ago after acute onset of shoulder pain thought to be secondary to shoulder bursitis. I started her on diclofenac twice a day. Her range of motion has improved today she is able to move her arm or but she still gets pain mostly at nighttime when she tries to sleep on the arm or when she reaches for something. She occasionally gets a shooting pain from her shoulder down to her hand. She denies any numbness or tingling in the fingertips. 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   Review of Systems - per above  GEN- denies fatigue, fever, weight loss,weakness, recent illness CVS- denies chest pain, palpitations MSK-+ joint pain, muscle aches, injury Neuro- denies headache, dizziness, syncope, seizure activity       Objective:   Physical Exam GEN- NAD, alert and oriented x3 NECK- FROM, supple MSK- Normal inspection upper extremity and shoulder, neg empty can test, neg impingment signs, biceps intact, TTP over Right  AC region, detloid mild TTP near bursa region, decreased back scratch compared to left, improved ROM - decreased flexion and adduction of right shoulder , EXT- No edema Pulses- Radial 2+       Assessment & Plan:

## 2013-04-22 NOTE — Patient Instructions (Signed)
Take the pain medication at night Continue voltaren gel Work on the exercises  Three times a day ICE your shoulder Give it another 2 weeks If not better orthopedics

## 2013-06-14 ENCOUNTER — Ambulatory Visit: Payer: BC Managed Care – PPO | Admitting: Family Medicine

## 2013-07-12 ENCOUNTER — Ambulatory Visit (INDEPENDENT_AMBULATORY_CARE_PROVIDER_SITE_OTHER): Payer: PRIVATE HEALTH INSURANCE | Admitting: Family Medicine

## 2013-07-12 VITALS — BP 128/60 | HR 80 | Temp 98.2°F | Resp 18 | Ht 66.5 in | Wt 277.0 lb

## 2013-07-12 DIAGNOSIS — R1310 Dysphagia, unspecified: Secondary | ICD-10-CM

## 2013-07-12 DIAGNOSIS — I10 Essential (primary) hypertension: Secondary | ICD-10-CM

## 2013-07-12 DIAGNOSIS — M25519 Pain in unspecified shoulder: Secondary | ICD-10-CM

## 2013-07-12 DIAGNOSIS — L659 Nonscarring hair loss, unspecified: Secondary | ICD-10-CM

## 2013-07-12 DIAGNOSIS — Z23 Encounter for immunization: Secondary | ICD-10-CM

## 2013-07-12 DIAGNOSIS — E785 Hyperlipidemia, unspecified: Secondary | ICD-10-CM

## 2013-07-12 DIAGNOSIS — E119 Type 2 diabetes mellitus without complications: Secondary | ICD-10-CM

## 2013-07-12 NOTE — Patient Instructions (Addendum)
Continue current medications Flu shot given Schedule mammogram F/U 4 months

## 2013-07-12 NOTE — Progress Notes (Signed)
   Subjective:    Patient ID: Courtney Hunt, female    DOB: May 21, 1961, 53 y.o.   MRN: [id number]  HPI  Pt here to f/u chronic medical problems- history of DM and HTN, DM is diet controlled,  , continues to have some difficulty with shoulder but declines any other intervetion Has had some dysphagia with foods such as meats past few months, denies heartburn symptoms Noticed hair thinning over past year, no alopecia, mother has some thinning hair but other siblings no problems.    Review of Systems  GEN- denies fatigue, fever, weight loss,weakness, recent illness HEENT- denies eye drainage, change in vision, nasal discharge, CVS- denies chest pain, palpitations RESP- denies SOB, cough, wheeze ABD- denies N/V, change in stools, abd pain GU- denies dysuria, hematuria, dribbling, incontinence MSK- + joint pain, muscle aches, injury Neuro- denies headache, dizziness, syncope, seizure activity      Objective:   Physical Exam GEN- NAD, alert and oriented x3 HEENT- PERRL, EOMI, non injected sclera, pink conjunctiva, MMM, oropharynx clear Neck- Supple, no thyromegaly CVS- RRR, no murmur RESP-CTAB EXT- No edema Pulses- Radial, DP- 2+ Skin- scalp- thinning front hair line/ and near crown, no lesions in scalp MSK- Some decreased ROM with back stratch right arm, Good ROM right shoulder, rotator cuff int act       Assessment & Plan:

## 2013-07-13 LAB — TSH: TSH: 1.403 u[IU]/mL (ref 0.350–4.500)

## 2013-07-13 LAB — CBC WITH DIFFERENTIAL/PLATELET
Basophils Absolute: 0 10*3/uL (ref 0.0–0.1)
Basophils Relative: 0 % (ref 0–1)
Eosinophils Absolute: 0.3 10*3/uL (ref 0.0–0.7)
Eosinophils Relative: 3 % (ref 0–5)
HCT: 33.1 % — ABNORMAL LOW (ref 36.0–46.0)
Hemoglobin: 11 g/dL — ABNORMAL LOW (ref 12.0–15.0)
Lymphocytes Relative: 31 % (ref 12–46)
Lymphs Abs: 3 10*3/uL (ref 0.7–4.0)
MCH: 25.8 pg — ABNORMAL LOW (ref 26.0–34.0)
MCHC: 33.2 g/dL (ref 30.0–36.0)
MCV: 77.7 fL — ABNORMAL LOW (ref 78.0–100.0)
Monocytes Absolute: 0.6 10*3/uL (ref 0.1–1.0)
Monocytes Relative: 7 % (ref 3–12)
Neutro Abs: 5.8 10*3/uL (ref 1.7–7.7)
Neutrophils Relative %: 59 % (ref 43–77)
Platelets: 295 10*3/uL (ref 150–400)
RBC: 4.26 MIL/uL (ref 3.87–5.11)
RDW: 17.7 % — ABNORMAL HIGH (ref 11.5–15.5)
WBC: 9.8 10*3/uL (ref 4.0–10.5)

## 2013-07-13 LAB — LIPID PANEL
Cholesterol: 169 mg/dL (ref 0–200)
HDL: 44 mg/dL (ref >39)
LDL Cholesterol: 108 mg/dL — ABNORMAL HIGH (ref 0–99)
Total CHOL/HDL Ratio: 3.8 Ratio
Triglycerides: 87 mg/dL (ref <150)
VLDL: 17 mg/dL (ref 0–40)

## 2013-07-13 LAB — HEMOGLOBIN A1C
Hgb A1c MFr Bld: 6.6 % — ABNORMAL HIGH (ref <5.7)
Mean Plasma Glucose: 143 mg/dL — ABNORMAL HIGH (ref <117)

## 2013-07-14 ENCOUNTER — Encounter: Payer: Self-pay | Admitting: Family Medicine

## 2013-07-14 DIAGNOSIS — R1310 Dysphagia, unspecified: Secondary | ICD-10-CM | POA: Insufficient documentation

## 2013-07-14 NOTE — Assessment & Plan Note (Signed)
ROM continues to improve She continues to decline ortho at this time, I think this is reasonable

## 2013-07-14 NOTE — Assessment & Plan Note (Signed)
BP well controlled.

## 2013-07-14 NOTE — Assessment & Plan Note (Signed)
Check TSH Start biotin, no alopecia Discussed OTC hair products

## 2013-07-14 NOTE — Assessment & Plan Note (Signed)
Check A1C, , diet controlled currently, on ACEI Flu shot given

## 2013-07-14 NOTE — Assessment & Plan Note (Signed)
Currently with meats, declines intervention Advised chopping into small pieces, fluids with meals

## 2013-07-14 NOTE — Assessment & Plan Note (Signed)
Weight gain noted, short term goals set

## 2013-08-05 ENCOUNTER — Encounter: Payer: Self-pay | Admitting: *Deleted

## 2013-08-23 ENCOUNTER — Telehealth: Payer: Self-pay | Admitting: Family Medicine

## 2013-08-23 NOTE — Telephone Encounter (Signed)
Call placed to patient to make aware.   Verbalized understanding.  

## 2013-08-23 NOTE — Telephone Encounter (Signed)
PT has a Spot under left arm and it has a smell to it as well Call back number is [phone]

## 2013-08-23 NOTE — Telephone Encounter (Signed)
Call placed to patient.   Reported that area noted under left arm near the breast that is red with black spot in center. There is no drainage and no pain noted.   Reported that odor is noted and smells like "old eggs".  MD please advise.

## 2013-08-23 NOTE — Telephone Encounter (Signed)
Have her use anti-bacterial soap twice a day on area Also place a warm compress to see if anything comes to a head Schedule appt if it does not get better could be beginnings of a boil

## 2013-09-23 ENCOUNTER — Other Ambulatory Visit: Payer: Self-pay | Admitting: Family Medicine

## 2013-09-23 DIAGNOSIS — Z1211 Encounter for screening for malignant neoplasm of colon: Secondary | ICD-10-CM

## 2013-09-23 DIAGNOSIS — Z1231 Encounter for screening mammogram for malignant neoplasm of breast: Secondary | ICD-10-CM

## 2013-09-25 ENCOUNTER — Encounter: Payer: Self-pay | Admitting: *Deleted

## 2013-09-26 ENCOUNTER — Encounter (INDEPENDENT_AMBULATORY_CARE_PROVIDER_SITE_OTHER): Payer: Self-pay | Admitting: *Deleted

## 2013-10-03 ENCOUNTER — Other Ambulatory Visit (INDEPENDENT_AMBULATORY_CARE_PROVIDER_SITE_OTHER): Payer: Self-pay | Admitting: *Deleted

## 2013-10-03 ENCOUNTER — Telehealth (INDEPENDENT_AMBULATORY_CARE_PROVIDER_SITE_OTHER): Payer: Self-pay | Admitting: *Deleted

## 2013-10-03 ENCOUNTER — Encounter (INDEPENDENT_AMBULATORY_CARE_PROVIDER_SITE_OTHER): Payer: Self-pay | Admitting: *Deleted

## 2013-10-03 DIAGNOSIS — Z1211 Encounter for screening for malignant neoplasm of colon: Secondary | ICD-10-CM

## 2013-10-03 NOTE — Telephone Encounter (Signed)
Patient needs trilyte 

## 2013-10-04 MED ORDER — PEG 3350-KCL-NA BICARB-NACL 420 G PO SOLR: 4000.0000 mL | Freq: Once | ORAL | Status: DC

## 2013-10-07 ENCOUNTER — Ambulatory Visit (HOSPITAL_COMMUNITY)
Admission: RE | Admit: 2013-10-07 | Discharge: 2013-10-07 | Disposition: A | Discharge status: Home / Self Care | Payer: PRIVATE HEALTH INSURANCE | Source: Ambulatory Visit | Attending: Family Medicine | Admitting: Family Medicine

## 2013-10-07 DIAGNOSIS — Z1231 Encounter for screening mammogram for malignant neoplasm of breast: Secondary | ICD-10-CM | POA: Insufficient documentation

## 2013-10-28 ENCOUNTER — Ambulatory Visit (INDEPENDENT_AMBULATORY_CARE_PROVIDER_SITE_OTHER): Payer: PRIVATE HEALTH INSURANCE | Admitting: Family Medicine

## 2013-10-28 ENCOUNTER — Encounter: Payer: Self-pay | Admitting: Family Medicine

## 2013-10-28 VITALS — BP 140/82 | HR 62 | Temp 98.4°F | Resp 16 | Ht 67.0 in | Wt 280.0 lb

## 2013-10-28 DIAGNOSIS — D489 Neoplasm of uncertain behavior, unspecified: Secondary | ICD-10-CM

## 2013-10-28 DIAGNOSIS — I781 Nevus, non-neoplastic: Secondary | ICD-10-CM

## 2013-10-28 MED ORDER — DICLOFENAC SODIUM 75 MG PO TBEC: 75.0000 mg | DELAYED_RELEASE_TABLET | Freq: Two times a day (BID) | ORAL | Status: DC

## 2013-10-28 NOTE — Patient Instructions (Signed)
Take the anti-inflammatory twice a day  Keep area clean from mole removal call if you have signs of infection Change f/u to July

## 2013-10-28 NOTE — Assessment & Plan Note (Signed)
Pathology sent, appears to be inflammed benign nevus

## 2013-10-28 NOTE — Progress Notes (Signed)
Patient ID: Courtney Hunt, female   DOB: 12/08/1960, 53 y.o.   MRN: [id number]   Subjective:    Patient ID: Courtney Hunt, female    DOB: [birth year], 53 y.o.   MRN: [id number]  Patient presents for Numb area on R upper, outside thigh and mole under L arm  patient here with a mole that gets caught in her bra strap. Mole has been present for a few years, but recently having discomfort and noted a layer of it peeled off.  She also notes it is a little bit bigger than previous but has been present for the past few years.  She has some numbness on the side of her leg a small area she didn't notice that varicose veins popped up in that region as well. They were initially tender to touch  Improved. She actually had numbness bilaterally but now only has it on the right side. She denies any back pain or any radiating symptoms from the back to the hip or the foot.    Review Of Systems:  GEN- denies fatigue, fever, weight loss,weakness, recent illness HEENT- denies eye drainage, change in vision, nasal discharge, CVS- denies chest pain, palpitations RESP- denies SOB, cough, wheeze ABD- denies N/V, change in stools, abd pain GU- denies dysuria, hematuria, dribbling, incontinence MSK- denies joint pain, muscle aches, injury Neuro- denies headache, dizziness, syncope, seizure activity       Objective:    BP 140/82  Pulse 62  Temp(Src) 98.4 F (36.9 C) (Oral)  Resp 16  Ht 5\' 7"  (1.702 m)  Wt 280 lb (127.007 kg)  BMI 43.84 kg/m2 GEN- NAD, alert and oriented x3 Skin- Left chest lateral to breast 0.5cm raised nevus with erythema at center,mild TTP, multiple spider veins on side of thighs and LE, mild TTP spider veins on Right lateral thigh, no cords palpated,  EXT- No edema Pulses- Radial, DP- 2+  Procedure- Mole Removal/shave biopsy Procedure explained to patient questions answered benefits and risks discussed verbal consent obtained. Antiseptic-betadine Anesthesia-lidocaine 1% with  Epi Shave biopsy Minimal blood loss, silver nitrate touched to lesion due to persistent oozing Pathology sent Patient tolerated procedure well Bandage applied        Assessment & Plan:      Problem List Items Addressed This Visit   Neoplasm of uncertain behavior - Primary   Relevant Orders      Pathology Report      Note: This dictation was prepared with Dragon dictation along with smaller phrase technology. Any transcriptional errors that result from this process are unintentional.

## 2013-10-28 NOTE — Assessment & Plan Note (Signed)
No true signs of phleblitis, will have her take NSAIDS BID for next few weeks

## 2013-10-30 LAB — PATHOLOGY

## 2013-11-11 ENCOUNTER — Ambulatory Visit: Payer: PRIVATE HEALTH INSURANCE | Admitting: Family Medicine

## 2013-12-12 ENCOUNTER — Encounter (HOSPITAL_COMMUNITY): Payer: Self-pay | Admitting: Pharmacy Technician

## 2013-12-13 ENCOUNTER — Telehealth (INDEPENDENT_AMBULATORY_CARE_PROVIDER_SITE_OTHER): Payer: Self-pay | Admitting: *Deleted

## 2013-12-13 NOTE — Telephone Encounter (Signed)
  Procedure: tcs  Reason/Indication:  screening  Has patient had this procedure before?  no  If so, when, by whom and where?    Is there a family history of colon cancer?  no  Who?  What age when diagnosed?    Is patient diabetic?   no      Does patient have prosthetic heart valve?  no  Do you have a pacemaker?  no  Has patient ever had endocarditis? no  Has patient had joint replacement within last 12 months?  no  Does patient tend to be constipated or take laxatives? no  Is patient on Coumadin, Plavix and/or Aspirin? no  Medications: ferrous sulfate 27 mg bid, lisinopril 5 mg daily  Allergies: nkda  Medication Adjustment: iron 10 days  Procedure date & time: 01/02/14 at 830

## 2013-12-16 NOTE — Telephone Encounter (Signed)
agree

## 2013-12-30 ENCOUNTER — Ambulatory Visit: Payer: PRIVATE HEALTH INSURANCE | Admitting: Family Medicine

## 2014-01-02 ENCOUNTER — Ambulatory Visit (HOSPITAL_COMMUNITY)
Admission: RE | Admit: 2014-01-02 | Discharge: 2014-01-02 | Disposition: A | Discharge status: Home / Self Care | Payer: PRIVATE HEALTH INSURANCE | Source: Ambulatory Visit | Attending: Internal Medicine | Admitting: Internal Medicine

## 2014-01-02 ENCOUNTER — Encounter (HOSPITAL_COMMUNITY)
Admission: RE | Disposition: A | Discharge status: Home / Self Care | Payer: Self-pay | Source: Ambulatory Visit | Attending: Internal Medicine

## 2014-01-02 ENCOUNTER — Encounter (HOSPITAL_COMMUNITY): Payer: Self-pay | Admitting: *Deleted

## 2014-01-02 DIAGNOSIS — D126 Benign neoplasm of colon, unspecified: Secondary | ICD-10-CM | POA: Insufficient documentation

## 2014-01-02 DIAGNOSIS — K5730 Diverticulosis of large intestine without perforation or abscess without bleeding: Secondary | ICD-10-CM | POA: Insufficient documentation

## 2014-01-02 DIAGNOSIS — I10 Essential (primary) hypertension: Secondary | ICD-10-CM | POA: Diagnosis not present

## 2014-01-02 DIAGNOSIS — Z1211 Encounter for screening for malignant neoplasm of colon: Secondary | ICD-10-CM | POA: Insufficient documentation

## 2014-01-02 DIAGNOSIS — Z803 Family history of malignant neoplasm of breast: Secondary | ICD-10-CM | POA: Diagnosis not present

## 2014-01-02 DIAGNOSIS — E119 Type 2 diabetes mellitus without complications: Secondary | ICD-10-CM | POA: Diagnosis not present

## 2014-01-02 DIAGNOSIS — E785 Hyperlipidemia, unspecified: Secondary | ICD-10-CM | POA: Insufficient documentation

## 2014-01-02 DIAGNOSIS — Z8041 Family history of malignant neoplasm of ovary: Secondary | ICD-10-CM | POA: Diagnosis not present

## 2014-01-02 DIAGNOSIS — Z79899 Other long term (current) drug therapy: Secondary | ICD-10-CM | POA: Diagnosis not present

## 2014-01-02 HISTORY — PX: COLONOSCOPY: SHX5424

## 2014-01-02 HISTORY — PX: POLYPECTOMY: SHX5525

## 2014-01-02 SURGERY — COLONOSCOPY
Anesthesia: Moderate Sedation

## 2014-01-02 MED ORDER — MEPERIDINE HCL 50 MG/ML IJ SOLN
INTRAMUSCULAR | Status: AC
  Filled 2014-01-02: qty 1

## 2014-01-02 MED ORDER — MIDAZOLAM HCL 5 MG/5ML IJ SOLN
INTRAMUSCULAR | Status: AC
  Filled 2014-01-02: qty 10

## 2014-01-02 MED ORDER — SIMETHICONE 40 MG/0.6ML PO SUSP
ORAL | Status: DC | PRN
  Administered 2014-01-02: 08:00:00

## 2014-01-02 MED ORDER — MEPERIDINE HCL 50 MG/ML IJ SOLN
INTRAMUSCULAR | Status: DC | PRN
  Administered 2014-01-02 (×2): 25 mg via INTRAVENOUS

## 2014-01-02 MED ORDER — MIDAZOLAM HCL 5 MG/5ML IJ SOLN
INTRAMUSCULAR | Status: DC | PRN
  Administered 2014-01-02: 2 mg via INTRAVENOUS
  Administered 2014-01-02: 1 mg via INTRAVENOUS
  Administered 2014-01-02: 2 mg via INTRAVENOUS
  Administered 2014-01-02: 1 mg via INTRAVENOUS

## 2014-01-02 NOTE — Op Note (Signed)
COLONOSCOPY PROCEDURE REPORT  PATIENT:  Courtney Hunt  MR#:  [id number] Birthdate:  [birth year], 53 y.o., female Endoscopist:  Dr. Rogene Houston, MD Referred By:  Dr. Vic Blackbird, MD Procedure Date: 01/02/2014  Procedure:   Colonoscopy  Indications: Patient is 54 year old African female who is undergoing average risk screening colonoscopy.  Informed Consent:  The procedure and risks were reviewed with the patient and informed consent was obtained.  Medications:  Demerol 50 mg IV Versed 6 mg IV  Description of procedure:  After a digital rectal exam was performed, that colonoscope was advanced from the anus through the rectum and colon to the area of the cecum, ileocecal valve and appendiceal orifice. The cecum was deeply intubated. These structures were well-seen and photographed for the record. From the level of the cecum and ileocecal valve, the scope was slowly and cautiously withdrawn. The mucosal surfaces were carefully surveyed utilizing scope tip to flexion to facilitate fold flattening as needed. The scope was pulled down into the rectum where a thorough exam including retroflexion was performed.  Findings:   Prep suboptimal particularly at the ascending colon and hepatic flexure. Small polyp at hepatic flexure removed with combination of cold snare and biopsy. Few small diverticula in sigmoid colon. Normal rectal mucosa and anal rectal junction.   Therapeutic/Diagnostic Maneuvers Performed:  See above   Complications:  None  Cecal Withdrawal Time:  16 minutes  Impression:  Examination performed to cecum. Small polyp removed from hepatic flexure using cold snare and biopsy. Mild sigmoid colon diverticulosis.  Prep suboptimal in proximal colon limiting the quality of exam.  Recommendations:  Standard instructions given. I will contact patient with biopsy results and further recommendations.  Biff Rutigliano U  01/02/2014 8:55 AM  CC: Dr. Vic Blackbird, MD & Dr. Rayne Du  ref. provider found

## 2014-01-02 NOTE — H&P (Signed)
Courtney Hunt is an 53 y.o. female.   Chief Complaint: Patient is here for colonoscopy. HPI: Patient is 53 year old African female who is here for screening colonoscopy.  She denies abdominal pain change in bowel habits or rectal bleeding. Family history is negative for CRC.  Past Medical History  Diagnosis Date  . Hypertension   . Hyperlipidemia   . Asthma   . Anemia   . Diabetes mellitus without complication     Past Surgical History  Procedure Laterality Date  . Appendectomy      Family History  Problem Relation Age of Onset  . Cancer Mother     ovarian and breast  . Heart disease Father   . Cancer Brother     bone   . Diabetes Sister   . Diabetes Sister    Social History:  reports that she has never smoked. She has never used smokeless tobacco. She reports that she does not drink alcohol or use illicit drugs.  Allergies: No Known Allergies  Medications Prior to Admission  Medication Sig Dispense Refill  . diclofenac (VOLTAREN) 75 MG EC tablet Take 1 tablet (75 mg total) by mouth 2 (two) times daily.  60 tablet  1  . lisinopril (PRINIVIL,ZESTRIL) 5 MG tablet Take 1 tablet (5 mg total) by mouth daily.  30 tablet  6  . polyethylene glycol-electrolytes (TRILYTE) 420 G solution Take 4,000 mLs by mouth once.  4000 mL  0    No results found for this or any previous visit (from the past 48 hour(s)). No results found.  ROS  Blood pressure 129/71, pulse 72, temperature 97.8 F (36.6 C), temperature source Oral, resp. rate 20, SpO2 95.00%. Physical Exam  Constitutional: She appears well-developed and well-nourished.  HENT:  Mouth/Throat: Oropharynx is clear and moist.  Eyes: Conjunctivae are normal. No scleral icterus.  Neck: No thyromegaly present.  Cardiovascular: Normal rate, regular rhythm and normal heart sounds.   No murmur heard. Respiratory: Effort normal and breath sounds normal.  GI: Soft. She exhibits no distension and no mass. There is no tenderness.   Small appendectomy scar at RLQ  Lymphadenopathy:    She has no cervical adenopathy.     Assessment/Plan Average risk screening colonoscopy.  REHMAN,NAJEEB U 01/02/2014, 8:11 AM

## 2014-01-02 NOTE — Discharge Instructions (Signed)
Resume usual medications and diet. No driving for 24 hours. Physician   Colon Polyps Polyps are lumps of extra tissue growing inside the body. Polyps can grow in the large intestine (colon). Most colon polyps are noncancerous (benign). However, some colon polyps can become cancerous over time. Polyps that are larger than a pea may be harmful. To be safe, caregivers remove and test all polyps. CAUSES  Polyps form when mutations in the genes cause your cells to grow and divide even though no more tissue is needed. RISK FACTORS There are a number of risk factors that can increase your chances of getting colon polyps. They include:  Being older than 50 years.  Family history of colon polyps or colon cancer.  Long-term colon diseases, such as colitis or Crohn disease.  Being overweight.  Smoking.  Being inactive.  Drinking too much alcohol. SYMPTOMS  Most small polyps do not cause symptoms. If symptoms are present, they may include:  Blood in the stool. The stool may look dark red or black.  Constipation or diarrhea that lasts longer than 1 week. DIAGNOSIS People often do not know they have polyps until their caregiver finds them during a regular checkup. Your caregiver can use 4 tests to check for polyps:  Digital rectal exam. The caregiver wears gloves and feels inside the rectum. This test would find polyps only in the rectum.  Barium enema. The caregiver puts a liquid called barium into your rectum before taking X-rays of your colon. Barium makes your colon look white. Polyps are dark, so they are easy to see in the X-ray pictures.  Sigmoidoscopy. A thin, flexible tube (sigmoidoscope) is placed into your rectum. The sigmoidoscope has a light and tiny camera in it. The caregiver uses the sigmoidoscope to look at the last third of your colon.  Colonoscopy. This test is like sigmoidoscopy, but the caregiver looks at the entire colon. This is the most common method for finding and  removing polyps. TREATMENT  Any polyps will be removed during a sigmoidoscopy or colonoscopy. The polyps are then tested for cancer. PREVENTION  To help lower your risk of getting more colon polyps:  Eat plenty of fruits and vegetables. Avoid eating fatty foods.  Do not smoke.  Avoid drinking alcohol.  Exercise every day.  Lose weight if recommended by your caregiver.  Eat plenty of calcium and folate. Foods that are rich in calcium include milk, cheese, and broccoli. Foods that are rich in folate include chickpeas, kidney beans, and spinach. HOME CARE INSTRUCTIONS Keep all follow-up appointments as directed by your caregiver. You may need periodic exams to check for polyps. SEEK MEDICAL CARE IF: You notice bleeding during a bowel movement. Document Released: 03/09/2004 Document Revised: 09/05/2011 Document Reviewed: 08/23/2011 [facility] Patient Information 2015 [place], [place]. This information is not intended to replace advice given to you by your health care provider. Make sure you discuss any questions you have with your health care provider. Diverticulosis Diverticulosis is the condition that develops when small pouches (diverticula) form in the wall of your colon. Your colon, or large intestine, is where water is absorbed and stool is formed. The pouches form when the inside layer of your colon pushes through weak spots in the outer layers of your colon. CAUSES  No one knows exactly what causes diverticulosis. RISK FACTORS  Being older than 31. Your risk for this condition increases with age. Diverticulosis is rare in people younger than 40 years. By age 90, almost everyone has it.  Eating a low-fiber diet.  Being frequently constipated.  Being overweight.  Not getting enough exercise.  Smoking.  Taking over-the-counter pain medicines, like aspirin and ibuprofen. SYMPTOMS  Most people with diverticulosis do not have symptoms. DIAGNOSIS  Because diverticulosis often  has no symptoms, health care providers often discover the condition during an exam for other colon problems. In many cases, a health care provider will diagnose diverticulosis while using a flexible scope to examine the colon (colonoscopy). TREATMENT  If you have never developed an infection related to diverticulosis, you may not need treatment. If you have had an infection before, treatment may include:  Eating more fruits, vegetables, and grains.  Taking a fiber supplement.  Taking a live bacteria supplement (probiotic).  Taking medicine to relax your colon. HOME CARE INSTRUCTIONS   Drink at least 6-8 glasses of water each day to prevent constipation.  Try not to strain when you have a bowel movement.  Keep all follow-up appointments. If you have had an infection before:  Increase the fiber in your diet as directed by your health care provider or dietitian.  Take a dietary fiber supplement if your health care provider approves.  Only take medicines as directed by your health care provider. SEEK MEDICAL CARE IF:   You have abdominal pain.  You have bloating.  You have cramps.  You have not gone to the bathroom in 3 days. SEEK IMMEDIATE MEDICAL CARE IF:   Your pain gets worse.  Yourbloating becomes very bad.  You have a fever or chills, and your symptoms suddenly get worse.  You begin vomiting.  You have bowel movements that are bloody or black. MAKE SURE YOU:  Understand these instructions.  Will watch your condition.  Will get help right away if you are not doing well or get worse. Document Released: 03/10/2004 Document Revised: 06/18/2013 Document Reviewed: 05/08/2013 [facility] Patient Information 2015 [place], [place]. This information is not intended to replace advice given to you by your health care provider. Make sure you discuss any questions you have with your health care provider. Colonoscopy, Care After These instructions give you information on  caring for yourself after your procedure. Your doctor may also give you more specific instructions. Call your doctor if you have any problems or questions after your procedure. HOME CARE  Do not drive for 24 hours.  Do not sign important papers or use machinery for 24 hours.  You may shower.  You may go back to your usual activities, but go slower for the first 24 hours.  Take rest breaks often during the first 24 hours.  Walk around or use warm packs on your belly (abdomen) if you have belly cramping or gas.  Drink enough fluids to keep your pee (urine) clear or pale yellow.  Resume your normal diet. Avoid heavy or fried foods.  Avoid drinking alcohol for 24 hours or as told by your doctor.  Only take medicines as told by your doctor. If a tissue sample (biopsy) was taken during the procedure:   Do not take aspirin or blood thinners for 7 days, or as told by your doctor.  Do not drink alcohol for 7 days, or as told by your doctor.  Eat soft foods for the first 24 hours. GET HELP IF: You still have a small amount of blood in your poop (stool) 2-3 days after the procedure. GET HELP RIGHT AWAY IF:  You have more than a small amount of blood in your poop.  You see  clumps of tissue (blood clots) in your poop.  Your belly is puffy (swollen).  You feel sick to your stomach (nauseous) or throw up (vomit).  You have a fever.  You have belly pain that gets worse and medicine does not help. MAKE SURE YOU:  Understand these instructions.  Will watch your condition.  Will get help right away if you are not doing well or get worse. Document Released: 07/16/2010 Document Revised: 06/18/2013 Document Reviewed: 02/18/2013 [facility] Patient Information 2015 [place], [place]. This information is not intended to replace advice given to you by your health care provider. Make sure you discuss any questions you have with your health care provider.  will call with biopsy results.

## 2014-01-06 ENCOUNTER — Encounter (HOSPITAL_COMMUNITY): Payer: Self-pay | Admitting: Internal Medicine

## 2014-01-17 ENCOUNTER — Encounter: Payer: Self-pay | Admitting: Family Medicine

## 2014-01-17 ENCOUNTER — Ambulatory Visit (INDEPENDENT_AMBULATORY_CARE_PROVIDER_SITE_OTHER): Payer: PRIVATE HEALTH INSURANCE | Admitting: Family Medicine

## 2014-01-17 VITALS — BP 134/76 | HR 72 | Temp 98.0°F | Resp 16 | Ht 67.0 in | Wt 280.0 lb

## 2014-01-17 DIAGNOSIS — E119 Type 2 diabetes mellitus without complications: Secondary | ICD-10-CM

## 2014-01-17 DIAGNOSIS — I10 Essential (primary) hypertension: Secondary | ICD-10-CM

## 2014-01-17 DIAGNOSIS — E785 Hyperlipidemia, unspecified: Secondary | ICD-10-CM

## 2014-01-17 MED ORDER — DICLOFENAC SODIUM 75 MG PO TBEC: 75.0000 mg | DELAYED_RELEASE_TABLET | Freq: Two times a day (BID) | ORAL | Status: DC

## 2014-01-17 MED ORDER — LISINOPRIL 5 MG PO TABS: 5.0000 mg | ORAL_TABLET | Freq: Every day | ORAL | Status: DC

## 2014-01-17 NOTE — Patient Instructions (Signed)
Continue current medications Get the labs done fasting  F/U 4 months

## 2014-01-17 NOTE — Progress Notes (Signed)
Patient ID: Courtney Hunt, female   DOB: 03/21/1961, 53 y.o.   MRN: [id number]   Subjective:    Patient ID: Courtney Hunt, female    DOB: 09-01-1960, 53 y.o.   MRN: [id number]  Patient presents for 2 month F/U  Patient here to follow chronic medical problems. She's no specific concerns today. She's taken her blood pressure medication as prescribed. She is diabetic but this is diet controlled she is due for repeat fasting labs.  The patient were reviewed. She's been walking some but has not lost any weight since her last visit   Review Of Systems:  GEN- denies fatigue, fever, weight loss,weakness, recent illness HEENT- denies eye drainage, change in vision, nasal discharge, CVS- denies chest pain, palpitations RESP- denies SOB, cough, wheeze ABD- denies N/V, change in stools, abd pain GU- denies dysuria, hematuria, dribbling, incontinence MSK- denies joint pain, muscle aches, injury Neuro- denies headache, dizziness, syncope, seizure activity       Objective:    BP 134/76  Pulse 72  Temp(Src) 98 F (36.7 C) (Oral)  Resp 16  Ht 5\' 7"  (1.702 m)  Wt 280 lb (127.007 kg)  BMI 43.84 kg/m2 GEN- NAD, alert and oriented x3 HEENT- PERRL, EOMI, non injected sclera, pink conjunctiva, MMM, oropharynx clear Neck- Supple, no thyromegaly CVS- RRR, no murmur RESP-CTAB ABD-NABS,soft,NT,ND EXT- No edema Pulses- Radial, DP- 2+        Assessment & Plan:      Problem List Items Addressed This Visit   HYPERLIPIDEMIA   Relevant Orders      Lipid panel   ESSENTIAL HYPERTENSION, BENIGN   Diabetes mellitus, type II - Primary   Relevant Orders      CBC with Differential      Comprehensive metabolic panel      Hemoglobin A1c      HM DIABETES FOOT EXAM (Completed)      Microalbumin / creatinine urine ratio      HM DIABETES FOOT EXAM (Completed)      Note: This dictation was prepared with Dragon dictation along with smaller phrase technology. Any transcriptional errors that result  from this process are unintentional.

## 2014-01-18 LAB — MICROALBUMIN / CREATININE URINE RATIO
Creatinine, Urine: 162.1 mg/dL
Microalb Creat Ratio: 5.1 mg/g (ref 0.0–30.0)
Microalb, Ur: 0.82 mg/dL (ref 0.00–1.89)

## 2014-01-19 NOTE — Assessment & Plan Note (Signed)
Well controlled, no change to meds 

## 2014-01-19 NOTE — Assessment & Plan Note (Signed)
Discussed need to work on dietary changes and activity to prevent increasing risk for CAD/STroke, DM complications

## 2014-01-19 NOTE — Assessment & Plan Note (Signed)
Diet controlled, last A1C 6.6%, repeat fasting labs,

## 2014-05-21 ENCOUNTER — Ambulatory Visit: Payer: PRIVATE HEALTH INSURANCE | Admitting: Family Medicine

## 2014-05-23 ENCOUNTER — Ambulatory Visit: Payer: PRIVATE HEALTH INSURANCE | Admitting: Family Medicine

## 2014-06-06 ENCOUNTER — Encounter: Payer: Self-pay | Admitting: Family Medicine

## 2014-06-06 ENCOUNTER — Ambulatory Visit (INDEPENDENT_AMBULATORY_CARE_PROVIDER_SITE_OTHER): Payer: PRIVATE HEALTH INSURANCE | Admitting: Family Medicine

## 2014-06-06 VITALS — BP 132/78 | HR 62 | Temp 98.2°F | Resp 14 | Ht 67.0 in | Wt 276.0 lb

## 2014-06-06 DIAGNOSIS — L600 Ingrowing nail: Secondary | ICD-10-CM

## 2014-06-06 DIAGNOSIS — E119 Type 2 diabetes mellitus without complications: Secondary | ICD-10-CM

## 2014-06-06 DIAGNOSIS — Z23 Encounter for immunization: Secondary | ICD-10-CM

## 2014-06-06 DIAGNOSIS — I10 Essential (primary) hypertension: Secondary | ICD-10-CM

## 2014-06-06 MED ORDER — DICLOFENAC SODIUM 75 MG PO TBEC: 75.0000 mg | DELAYED_RELEASE_TABLET | Freq: Two times a day (BID) | ORAL | Status: DC

## 2014-06-06 MED ORDER — LISINOPRIL 5 MG PO TABS: 5.0000 mg | ORAL_TABLET | Freq: Every day | ORAL | Status: AC

## 2014-06-06 NOTE — Assessment & Plan Note (Signed)
Diet controlled.  

## 2014-06-06 NOTE — Assessment & Plan Note (Signed)
Well controlled continue lisinopril, fasting labs to be done Continue working on weight loss and healthy eating habits

## 2014-06-06 NOTE — Patient Instructions (Signed)
Continue current meds Get labs done fasting Keep working on weight loss Schedule for nail removal when you want it F/U 6 months

## 2014-06-06 NOTE — Progress Notes (Signed)
Patient ID: Courtney Hunt, female   DOB: 1961/04/04, 53 y.o.   MRN: [id number]   Subjective:    Patient ID: Courtney Hunt, female    DOB: 1961/04/07, 52 y.o.   MRN: [id number]  Patient presents for 4 month F/U  No specific concerns, working on weight loss, diet controlled DM,due for fasting labs. Flu shot also needed Meds reviewed    Review Of Systems:  GEN- denies fatigue, fever, weight loss,weakness, recent illness HEENT- denies eye drainage, change in vision, nasal discharge, CVS- denies chest pain, palpitations RESP- denies SOB, cough, wheeze ABD- denies N/V, change in stools, abd pain GU- denies dysuria, hematuria, dribbling, incontinence MSK- denies joint pain, muscle aches, injury Neuro- denies headache, dizziness, syncope, seizure activity       Objective:    BP 132/78 mmHg  Pulse 62  Temp(Src) 98.2 F (36.8 C) (Oral)  Resp 14  Ht 5\' 7"  (1.702 m)  Wt 276 lb (125.193 kg)  BMI 43.22 kg/m2 GEN- NAD, alert and oriented x3 HEENT- PERRL, EOMI, non injected sclera, pink conjunctiva, MMM, oropharynx clear CVS- RRR, no murmur RESP-CTAB EXT- No edema Pulses- Radial 2+        Assessment & Plan:      Problem List Items Addressed This Visit      Unprioritized   ESSENTIAL HYPERTENSION, BENIGN   Relevant Medications      lisinopril (PRINIVIL,ZESTRIL) tablet   Diabetes mellitus, type II   Relevant Medications      lisinopril (PRINIVIL,ZESTRIL) tablet    Other Visit Diagnoses    Ingrown toenail    -  Primary    Need for prophylactic vaccination and inoculation against influenza        Relevant Orders       Flu Vaccine QUAD 36+ mos PF IM (Fluarix Quad PF) (Completed)       Note: This dictation was prepared with Dragon dictation along with smaller phrase technology. Any transcriptional errors that result from this process are unintentional.

## 2014-06-13 LAB — CBC WITH DIFFERENTIAL/PLATELET
Basophils Absolute: 0 10*3/uL (ref 0.0–0.1)
Basophils Relative: 0 % (ref 0–1)
Eosinophils Absolute: 0.3 10*3/uL (ref 0.0–0.7)
Eosinophils Relative: 3 % (ref 0–5)
HCT: 35.7 % — ABNORMAL LOW (ref 36.0–46.0)
Hemoglobin: 11.6 g/dL — ABNORMAL LOW (ref 12.0–15.0)
Lymphocytes Relative: 37 % (ref 12–46)
Lymphs Abs: 3.4 10*3/uL (ref 0.7–4.0)
MCH: 25.7 pg — ABNORMAL LOW (ref 26.0–34.0)
MCHC: 32.5 g/dL (ref 30.0–36.0)
MCV: 79.2 fL (ref 78.0–100.0)
Monocytes Absolute: 0.5 10*3/uL (ref 0.1–1.0)
Monocytes Relative: 5 % (ref 3–12)
Neutro Abs: 5.1 10*3/uL (ref 1.7–7.7)
Neutrophils Relative %: 55 % (ref 43–77)
Platelets: 317 10*3/uL (ref 150–400)
RBC: 4.51 MIL/uL (ref 3.87–5.11)
RDW: 16.7 % — ABNORMAL HIGH (ref 11.5–15.5)
WBC: 9.2 10*3/uL (ref 4.0–10.5)

## 2014-06-13 LAB — COMPREHENSIVE METABOLIC PANEL
ALT: 32 U/L (ref 0–35)
AST: 38 U/L — ABNORMAL HIGH (ref 0–37)
Albumin: 3.8 g/dL (ref 3.5–5.2)
Alkaline Phosphatase: 76 U/L (ref 39–117)
BUN: 14 mg/dL (ref 6–23)
CO2: 28 mEq/L (ref 19–32)
Calcium: 9 mg/dL (ref 8.4–10.5)
Chloride: 104 mEq/L (ref 96–112)
Creat: 0.82 mg/dL (ref 0.50–1.10)
Glucose, Bld: 89 mg/dL (ref 70–99)
Potassium: 3.9 mEq/L (ref 3.5–5.3)
Sodium: 136 mEq/L (ref 135–145)
Total Bilirubin: 0.4 mg/dL (ref 0.2–1.2)
Total Protein: 7.1 g/dL (ref 6.0–8.3)

## 2014-06-13 LAB — LIPID PANEL
Cholesterol: 180 mg/dL (ref 0–200)
HDL: 47 mg/dL (ref >39)
LDL Cholesterol: 117 mg/dL — ABNORMAL HIGH (ref 0–99)
Total CHOL/HDL Ratio: 3.8 Ratio
Triglycerides: 79 mg/dL (ref <150)
VLDL: 16 mg/dL (ref 0–40)

## 2014-06-13 LAB — HEMOGLOBIN A1C
Hgb A1c MFr Bld: 6.6 % — ABNORMAL HIGH (ref <5.7)
Mean Plasma Glucose: 143 mg/dL — ABNORMAL HIGH (ref <117)

## 2014-06-16 ENCOUNTER — Encounter: Payer: Self-pay | Admitting: *Deleted

## 2014-07-21 ENCOUNTER — Encounter: Payer: Self-pay | Admitting: Family Medicine

## 2014-08-21 ENCOUNTER — Encounter: Payer: Self-pay | Admitting: Family Medicine

## 2014-09-25 ENCOUNTER — Encounter: Payer: Self-pay | Admitting: Family Medicine

## 2014-09-26 ENCOUNTER — Ambulatory Visit (INDEPENDENT_AMBULATORY_CARE_PROVIDER_SITE_OTHER): Payer: PRIVATE HEALTH INSURANCE | Admitting: Family Medicine

## 2014-09-26 ENCOUNTER — Encounter: Payer: Self-pay | Admitting: Family Medicine

## 2014-09-26 VITALS — BP 128/78 | HR 76 | Temp 98.3°F | Resp 14 | Ht 67.0 in | Wt 286.0 lb

## 2014-09-26 DIAGNOSIS — L600 Ingrowing nail: Secondary | ICD-10-CM | POA: Diagnosis not present

## 2014-09-26 DIAGNOSIS — K625 Hemorrhage of anus and rectum: Secondary | ICD-10-CM

## 2014-09-26 MED ORDER — CEPHALEXIN 500 MG PO CAPS: 500.0000 mg | ORAL_CAPSULE | Freq: Two times a day (BID) | ORAL | Status: DC

## 2014-09-26 MED ORDER — HYDROCODONE-ACETAMINOPHEN 5-325 MG PO TABS: 1.0000 | ORAL_TABLET | Freq: Four times a day (QID) | ORAL | Status: DC | PRN

## 2014-09-26 NOTE — Patient Instructions (Addendum)
Keep bandage on for next 24 hours Keep vaseline or triple antibiotic on  Call for any signs of infection Take antibiotics as prescribed Take pain meds as prescribed F/U as previous

## 2014-09-26 NOTE — Progress Notes (Signed)
Patient ID: Courtney Hunt, female   DOB: 17-Aug-1960, 54 y.o.   MRN: [id number]   Subjective:    Patient ID: Courtney Hunt, female    DOB: 26-Apr-1961, 54 y.o.   MRN: [id number]  Patient presents for L Foot Great Toe Ingrown Nail  Pt here for ingrown toenail, she had in December and it worened and she presents for removal of nail. No drainage from nails but has had increased swelling and pain. She did clip some of the skin off herself on medial aspect.  Note at end of visit, pt called me back into room states for 2 days had small amount of rectal bleeding with BM, no abd pain, no recent constipation, had some burning when she wiped. Colonoscopy UTD, few polyps    Review Of Systems:  GEN- denies fatigue, fever, weight loss,weakness, recent illness HEENT- denies eye drainage, change in vision, nasal discharge, CVS- denies chest pain, palpitations RESP- denies SOB, cough, wheeze ABD- denies N/V, +change in stools, abd pain GU- denies dysuria, hematuria, dribbling, incontinence MSK- denies joint pain, muscle aches, injury Neuro- denies headache, dizziness, syncope, seizure activity       Objective:    BP 128/78 mmHg  Pulse 76  Temp(Src) 98.3 F (36.8 C) (Oral)  Resp 14  Ht 5\' 7"  (1.702 m)  Wt 286 lb (129.729 kg)  BMI 44.78 kg/m2 GEN- NAD, alert and oriented x3 Ext- Left foot- ingrown toenail bilat, very thick nail on lateral edge, TTP, mild swelling, no pus Pulse- DP2+   Procedure- Toenail Removal Procedure explained to patient questions answered benefits and risks discussed Verbal consent obtained. Antiseptic-Betadine Anesthesia-lidocaine 2% NO EPI Nail clipped on medial aspect and levator used to lift nail from nail bed, medial ingrown nail removed, nail was very thick on lateral edge and when levator used entire nail lifted therefore pt had full nail removal. Small amount of drysol per qtip applied to control bleeding, vaseline gauze and pressure bandage applied Minimal blood  loss Patient tolerated procedure well Bandage applied        Assessment & Plan:      Problem List Items Addressed This Visit    None    Visit Diagnoses    Ingrown left big toenail    -  Primary    s/p toenail removal, entire nail remove as very thick with deep ingrown sides, pressure bandaged applied Keflex x 5 days, norco given for pain    Rectal bleeding        Acute episode with small amoutn of blood, declined exam, mostly likley hemorroidal or skin tear. Advised sitz bath, tucks, stool softner, return or GI if not be       Note: This dictation was prepared with Dragon dictation along with smaller phrase technology. Any transcriptional errors that result from this process are unintentional.

## 2014-12-12 ENCOUNTER — Ambulatory Visit: Payer: PRIVATE HEALTH INSURANCE | Admitting: Family Medicine

## 2015-02-02 ENCOUNTER — Other Ambulatory Visit (HOSPITAL_COMMUNITY): Payer: Self-pay | Admitting: Physician Assistant

## 2015-02-02 DIAGNOSIS — Z1231 Encounter for screening mammogram for malignant neoplasm of breast: Secondary | ICD-10-CM

## 2015-02-09 ENCOUNTER — Other Ambulatory Visit (HOSPITAL_COMMUNITY): Payer: Self-pay | Admitting: Physician Assistant

## 2015-02-09 ENCOUNTER — Ambulatory Visit (HOSPITAL_COMMUNITY)
Admission: RE | Admit: 2015-02-09 | Discharge: 2015-02-09 | Disposition: A | Discharge status: Home / Self Care | Payer: PRIVATE HEALTH INSURANCE | Source: Ambulatory Visit | Attending: Physician Assistant | Admitting: Physician Assistant

## 2015-02-09 ENCOUNTER — Other Ambulatory Visit (HOSPITAL_COMMUNITY): Payer: Self-pay | Admitting: Family Medicine

## 2015-02-09 ENCOUNTER — Ambulatory Visit (HOSPITAL_COMMUNITY): Payer: PRIVATE HEALTH INSURANCE

## 2015-02-09 DIAGNOSIS — Z1231 Encounter for screening mammogram for malignant neoplasm of breast: Secondary | ICD-10-CM | POA: Diagnosis not present

## 2019-01-01 ENCOUNTER — Encounter (INDEPENDENT_AMBULATORY_CARE_PROVIDER_SITE_OTHER): Payer: Self-pay | Admitting: *Deleted

## 2020-09-28 ENCOUNTER — Other Ambulatory Visit: Payer: Self-pay | Admitting: Physician Assistant

## 2020-09-28 DIAGNOSIS — R1310 Dysphagia, unspecified: Secondary | ICD-10-CM

## 2020-10-09 ENCOUNTER — Ambulatory Visit
Admission: RE | Admit: 2020-10-09 | Discharge: 2020-10-09 | Disposition: A | Discharge status: Home / Self Care | Payer: BLUE CROSS/BLUE SHIELD | Source: Ambulatory Visit | Attending: Physician Assistant | Admitting: Physician Assistant

## 2020-10-09 DIAGNOSIS — R1310 Dysphagia, unspecified: Secondary | ICD-10-CM

## 2021-06-02 IMAGING — RF DG ESOPHAGUS
7 of 9 series · 14 of 24 positions shown · non-contrast
Comparison: None.

CLINICAL DATA: Esophageal dysphagia.

EXAM:
ESOPHOGRAM / BARIUM SWALLOW / BARIUM TABLET STUDY
TECHNIQUE: Combined double contrast and single contrast examination performed
using effervescent crystals, thick barium liquid, and thin barium
liquid. The patient was observed with fluoroscopy swallowing a 13 mm
barium sulphate tablet.
FLUOROSCOPY TIME:  Fluoroscopy Time:  1 minutes and 54 seconds
Radiation Exposure Index (if provided by the fluoroscopic device):
166 mGy
Number of Acquired Spot Images: 0

[Series 1: sequence · 2 of 16 frames shown (1 of 5)]
[frame 3/16]
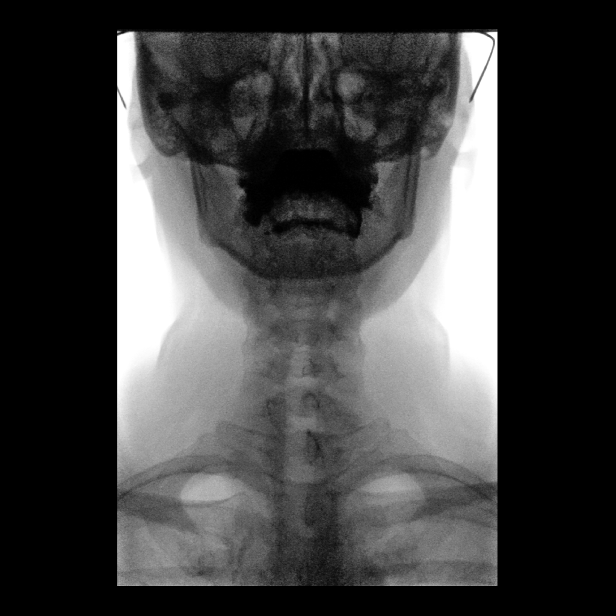
[frame 9/16]
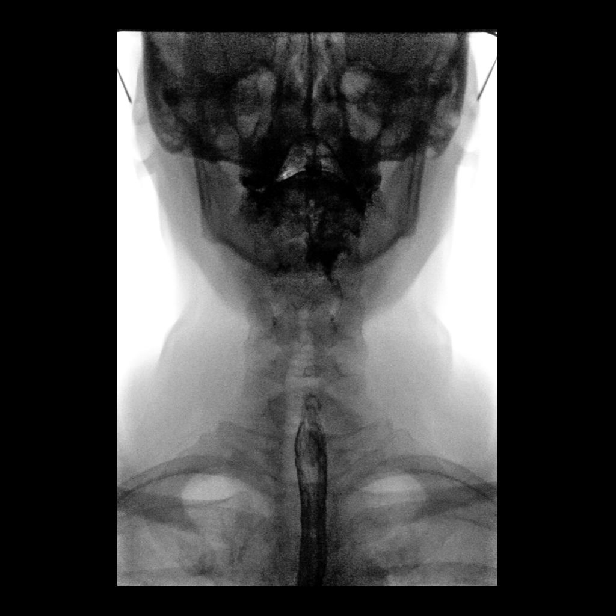

[Series 2: sequence · 2 of 15 frames shown (2 of 5)]
[frame 8/15]
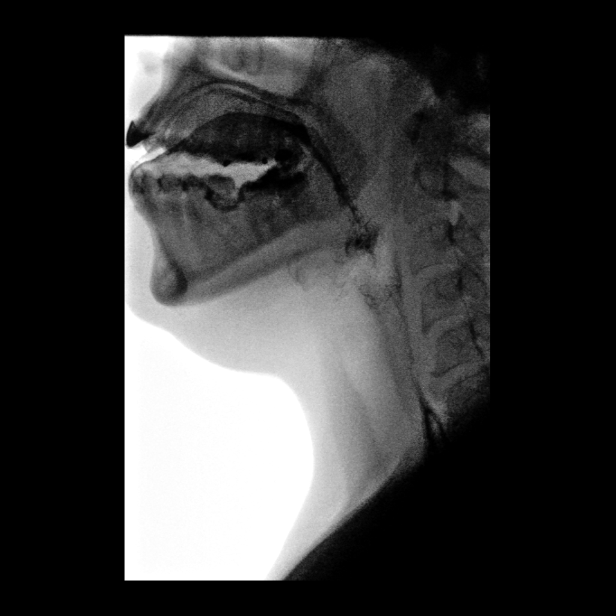
[frame 15/15]
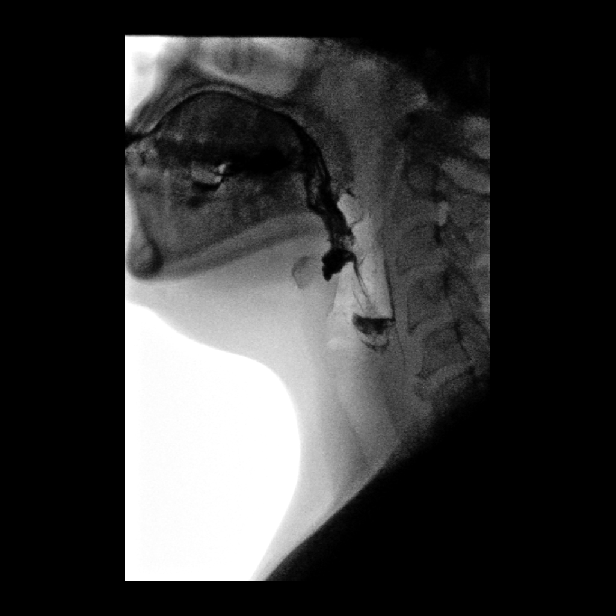

[Series 3: sequence · 2 of 21 frames shown (3 of 5)]
[frame 4/21]
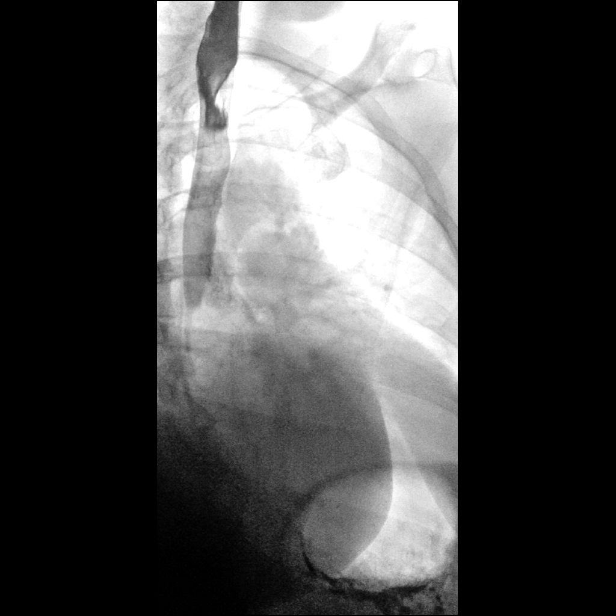
[frame 21/21]
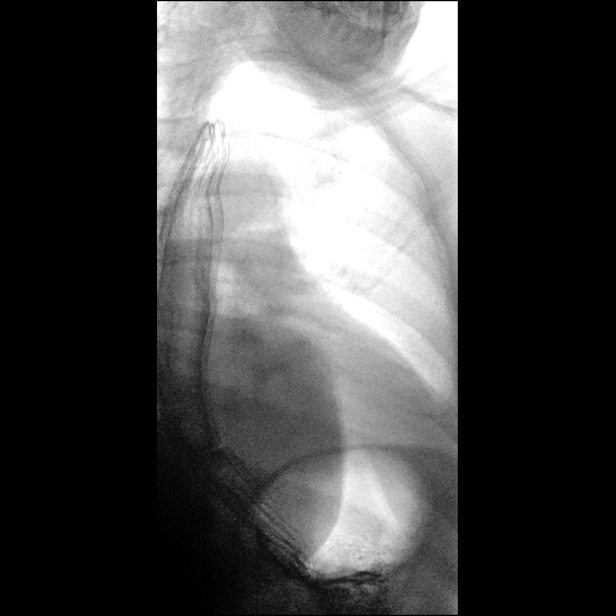

[Series 5: sequence · 3 of 16 frames shown (4 of 5)]
[frame 3/16]
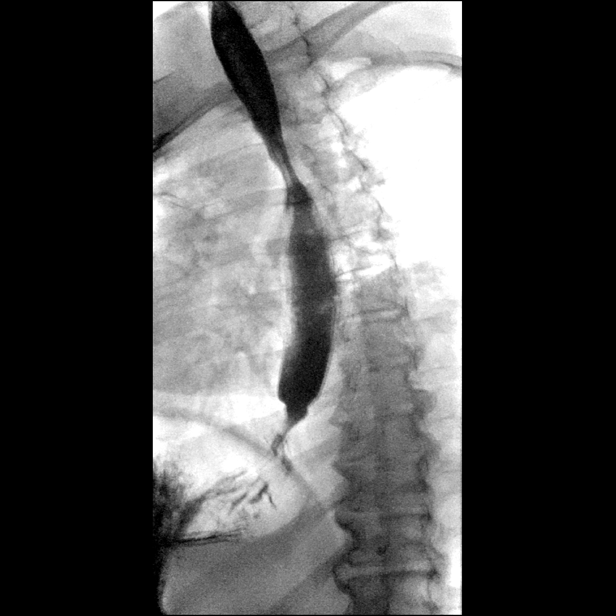
[frame 9/16]
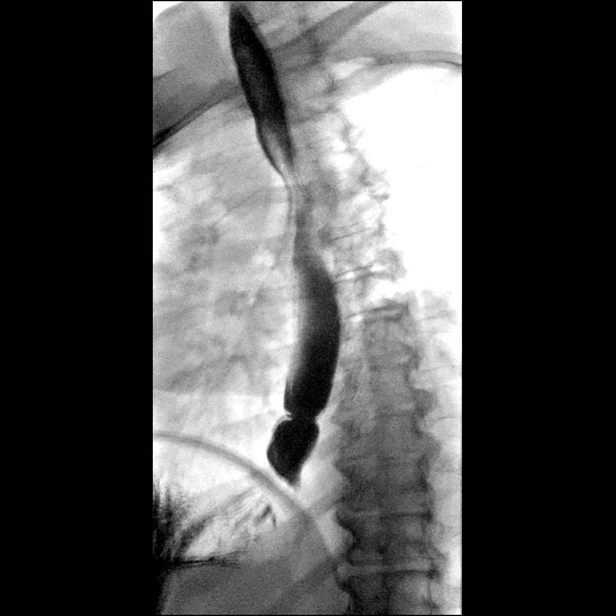
[frame 16/16]
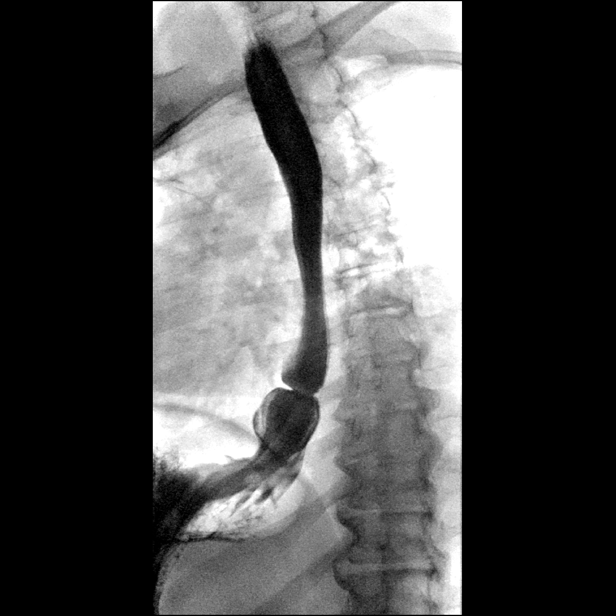

[Series 8: one shot · 2 of 3 slices shown (1 of 2)]
[im 1/3]
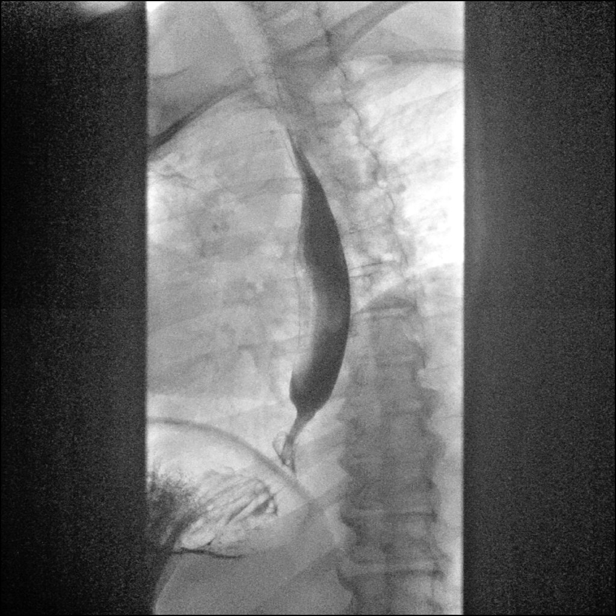
[im 3/3]
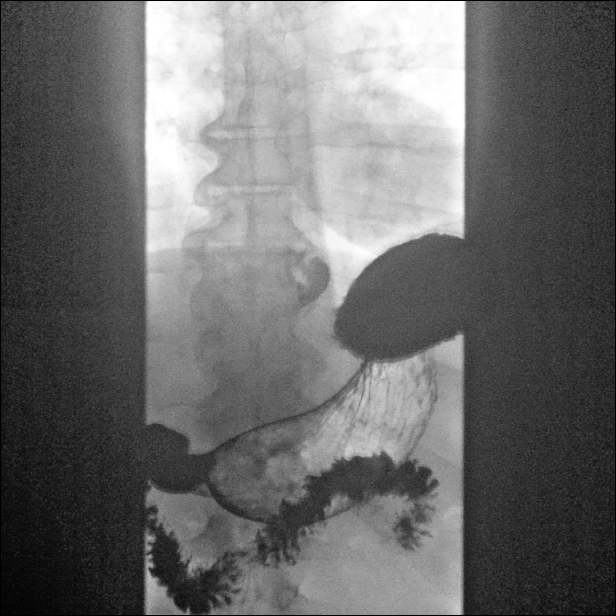

[Series 9: sequence · 2 of 22 frames shown (5 of 5)]
[frame 3/22]
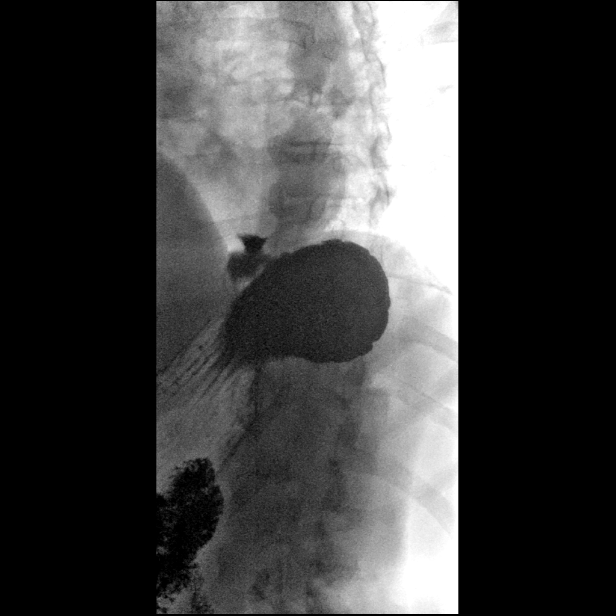
[frame 19/22]
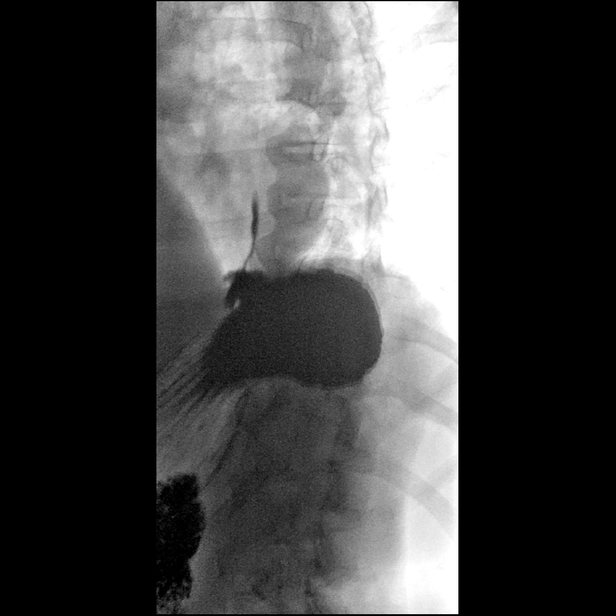

[Series 10: one shot · 1 of 2 slices shown (2 of 2)]
[im 2/2]
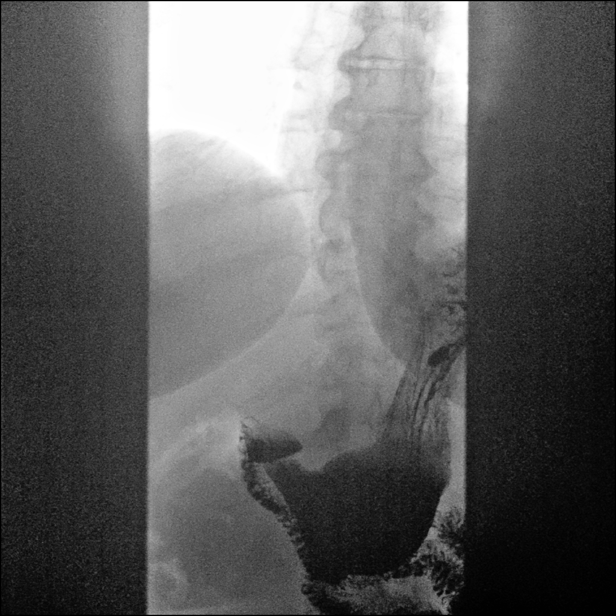

[14 of 24 positions shown; findings below may reference images not displayed]

FINDINGS: Initial barium swallows demonstrate normal pharyngeal motion with
swallowing. No laryngeal penetration or aspiration. No upper
esophageal webs, strictures or diverticuli.

Normal esophageal motility. No intrinsic or extrinsic lesions are
identified.

There is a small to moderate-sized hiatal hernia and a lower
esophageal mucosal B ring (Schatzki's ring) at the GE junction. It
appears fairly tight but the 13 mm barium pill did pass through it.

Episodes of spontaneous and inducible GE reflux.
IMPRESSION: 1. Normal esophageal motility.
2. Lower esophageal mucosal B ring (Schatzki's ring). This appears
fairly tight but the 13 mm barium pill did pass through it.
3. Small to moderate-sized sliding-type hiatal hernia and GE reflux.

## 2021-07-15 ENCOUNTER — Ambulatory Visit: Payer: 59 | Admitting: Podiatry

## 2021-07-15 ENCOUNTER — Other Ambulatory Visit: Payer: Self-pay

## 2022-04-27 ENCOUNTER — Encounter: Payer: 59 | Admitting: Obstetrics & Gynecology

## 2022-05-03 ENCOUNTER — Telehealth: Payer: Self-pay

## 2022-05-03 NOTE — Telephone Encounter (Signed)
I spoke to patient about the referral that was received from [place] that she no showed on 04/27/22. She stated that her primary called and stated that they will see her for the brake through bleeding. Patient states to disregard the referral.
